# Patient Record
Sex: Female | Born: 1990 | Race: White | Hispanic: No | Smoking: Never smoker
Health system: Southern US, Community
[De-identification: ages and names within clinical notes are randomized; demographics above are authoritative.]

## PROBLEM LIST (undated history)

## (undated) DIAGNOSIS — R011 Cardiac murmur, unspecified: Secondary | ICD-10-CM

## (undated) DIAGNOSIS — E611 Iron deficiency: Secondary | ICD-10-CM

## (undated) DIAGNOSIS — F329 Major depressive disorder, single episode, unspecified: Secondary | ICD-10-CM

## (undated) DIAGNOSIS — E162 Hypoglycemia, unspecified: Secondary | ICD-10-CM

## (undated) DIAGNOSIS — F419 Anxiety disorder, unspecified: Secondary | ICD-10-CM

## (undated) DIAGNOSIS — R0600 Dyspnea, unspecified: Secondary | ICD-10-CM

## (undated) DIAGNOSIS — F32A Depression, unspecified: Secondary | ICD-10-CM

---
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
## 2012-11-19 ENCOUNTER — Encounter: Payer: Self-pay | Admitting: Obstetrics & Gynecology

## 2012-12-18 ENCOUNTER — Emergency Department: Payer: Self-pay | Admitting: Emergency Medicine

## 2012-12-18 LAB — URINALYSIS, COMPLETE
Bilirubin,UR: NEGATIVE
Blood: NEGATIVE
Protein: NEGATIVE
RBC,UR: 4 /HPF (ref 0–5)
Specific Gravity: 1.024 (ref 1.003–1.030)
WBC UR: 7 /HPF (ref 0–5)

## 2012-12-18 LAB — COMPREHENSIVE METABOLIC PANEL
Albumin: 3.4 g/dL (ref 3.4–5.0)
Alkaline Phosphatase: 68 U/L (ref 50–136)
BUN: 7 mg/dL (ref 7–18)
Calcium, Total: 9.2 mg/dL (ref 8.5–10.1)
Co2: 23 mmol/L (ref 21–32)
Creatinine: 0.58 mg/dL — ABNORMAL LOW (ref 0.60–1.30)
EGFR (African American): >60
Potassium: 3.8 mmol/L (ref 3.5–5.1)
SGOT(AST): 25 U/L (ref 15–37)
Total Protein: 7.6 g/dL (ref 6.4–8.2)

## 2012-12-18 LAB — CBC
HCT: 38.1 % (ref 35.0–47.0)
MCH: 33.1 pg (ref 26.0–34.0)
MCHC: 35.2 g/dL (ref 32.0–36.0)
RBC: 4.05 10*6/uL (ref 3.80–5.20)
WBC: 11.8 10*3/uL — ABNORMAL HIGH (ref 3.6–11.0)

## 2012-12-18 LAB — LIPASE, BLOOD: Lipase: 68 U/L — ABNORMAL LOW (ref 73–393)

## 2012-12-19 LAB — URINE CULTURE

## 2012-12-21 ENCOUNTER — Encounter: Payer: Self-pay | Admitting: Obstetrics and Gynecology

## 2012-12-21 LAB — URINALYSIS, COMPLETE
Blood: NEGATIVE
Nitrite: NEGATIVE
Ph: 5 (ref 4.5–8.0)
RBC,UR: 4 /HPF (ref 0–5)
Specific Gravity: 1.029 (ref 1.003–1.030)
Squamous Epithelial: 28
WBC UR: 29 /HPF (ref 0–5)

## 2012-12-21 LAB — COMPREHENSIVE METABOLIC PANEL
Albumin: 3.4 g/dL (ref 3.4–5.0)
Alkaline Phosphatase: 67 U/L (ref 50–136)
BUN: 6 mg/dL — ABNORMAL LOW (ref 7–18)
Bilirubin,Total: 0.5 mg/dL (ref 0.2–1.0)
Calcium, Total: 8.9 mg/dL (ref 8.5–10.1)
Creatinine: 0.57 mg/dL — ABNORMAL LOW (ref 0.60–1.30)
EGFR (African American): >60
EGFR (Non-African Amer.): >60
SGOT(AST): 21 U/L (ref 15–37)
SGPT (ALT): 27 U/L (ref 12–78)

## 2012-12-21 LAB — AMYLASE: Amylase: 33 U/L (ref 25–115)

## 2012-12-21 LAB — LIPASE, BLOOD: Lipase: 67 U/L — ABNORMAL LOW (ref 73–393)

## 2012-12-30 ENCOUNTER — Emergency Department: Payer: Self-pay | Admitting: Emergency Medicine

## 2012-12-30 LAB — CBC
HGB: 12.9 g/dL (ref 12.0–16.0)
MCH: 32.6 pg (ref 26.0–34.0)
MCHC: 33.9 g/dL (ref 32.0–36.0)
Platelet: 344 10*3/uL (ref 150–440)
WBC: 14.5 10*3/uL — ABNORMAL HIGH (ref 3.6–11.0)

## 2012-12-30 LAB — COMPREHENSIVE METABOLIC PANEL
BUN: 6 mg/dL — ABNORMAL LOW (ref 7–18)
Bilirubin,Total: 0.2 mg/dL (ref 0.2–1.0)
Chloride: 109 mmol/L — ABNORMAL HIGH (ref 98–107)
Co2: 24 mmol/L (ref 21–32)
EGFR (African American): >60
Glucose: 72 mg/dL (ref 65–99)
Osmolality: 274 (ref 275–301)
Potassium: 3.9 mmol/L (ref 3.5–5.1)
SGPT (ALT): 26 U/L (ref 12–78)
Sodium: 139 mmol/L (ref 136–145)
Total Protein: 7.1 g/dL (ref 6.4–8.2)

## 2012-12-30 LAB — URINALYSIS, COMPLETE
Bilirubin,UR: NEGATIVE
Nitrite: NEGATIVE
Protein: 30
RBC,UR: 2 /HPF (ref 0–5)
Specific Gravity: 1.029 (ref 1.003–1.030)
Squamous Epithelial: 31

## 2013-01-07 ENCOUNTER — Encounter: Payer: Self-pay | Admitting: Obstetrics and Gynecology

## 2013-05-31 ENCOUNTER — Observation Stay: Payer: Self-pay

## 2013-06-05 ENCOUNTER — Observation Stay: Payer: Self-pay

## 2013-06-05 LAB — URINALYSIS, COMPLETE
Bilirubin,UR: NEGATIVE
Glucose,UR: NEGATIVE mg/dL (ref 0–75)
Nitrite: NEGATIVE
Ph: 5 (ref 4.5–8.0)
Protein: NEGATIVE
RBC,UR: 56 /HPF (ref 0–5)
Squamous Epithelial: 26

## 2013-06-06 ENCOUNTER — Inpatient Hospital Stay: Payer: Self-pay | Admitting: Obstetrics and Gynecology

## 2013-06-06 LAB — CBC WITH DIFFERENTIAL/PLATELET
Basophil #: 0.1 10*3/uL (ref 0.0–0.1)
HCT: 34 % — ABNORMAL LOW (ref 35.0–47.0)
HGB: 11.6 g/dL — ABNORMAL LOW (ref 12.0–16.0)
Lymphocyte %: 10.9 %
MCH: 33 pg (ref 26.0–34.0)
MCV: 97 fL (ref 80–100)
Monocyte #: 1.2 x10 3/mm — ABNORMAL HIGH (ref 0.2–0.9)
Monocyte %: 5.6 %
Neutrophil #: 17.9 10*3/uL — ABNORMAL HIGH (ref 1.4–6.5)
Neutrophil %: 83 %
Platelet: 310 10*3/uL (ref 150–440)
RBC: 3.52 10*6/uL — ABNORMAL LOW (ref 3.80–5.20)
RDW: 12.2 % (ref 11.5–14.5)
WBC: 21.6 10*3/uL — ABNORMAL HIGH (ref 3.6–11.0)

## 2013-06-07 LAB — HEMATOCRIT: HCT: 32 % — ABNORMAL LOW (ref 35.0–47.0)

## 2014-04-09 ENCOUNTER — Observation Stay: Payer: Self-pay | Admitting: Obstetrics and Gynecology

## 2014-04-09 LAB — URINALYSIS, COMPLETE
BLOOD: NEGATIVE
Bacteria: NONE SEEN
Bilirubin,UR: NEGATIVE
GLUCOSE, UR: NEGATIVE mg/dL (ref 0–75)
Hyaline Cast: 2
KETONE: NEGATIVE
Leukocyte Esterase: NEGATIVE
Nitrite: NEGATIVE
Ph: 6 (ref 4.5–8.0)
Protein: NEGATIVE
SPECIFIC GRAVITY: 1.027 (ref 1.003–1.030)

## 2014-05-13 ENCOUNTER — Observation Stay: Payer: Self-pay | Admitting: Obstetrics and Gynecology

## 2014-05-13 LAB — URINALYSIS, COMPLETE
BACTERIA: NONE SEEN
BILIRUBIN, UR: NEGATIVE
BLOOD: NEGATIVE
GLUCOSE, UR: NEGATIVE mg/dL (ref 0–75)
KETONE: NEGATIVE
NITRITE: NEGATIVE
PH: 6 (ref 4.5–8.0)
Protein: NEGATIVE
SPECIFIC GRAVITY: 1.008 (ref 1.003–1.030)
WBC UR: 1 /HPF (ref 0–5)

## 2014-05-13 LAB — HEMOGLOBIN: HGB: 11.3 g/dL — ABNORMAL LOW (ref 12.0–16.0)

## 2014-05-13 LAB — HEMATOCRIT: HCT: 34.2 % — ABNORMAL LOW (ref 35.0–47.0)

## 2014-06-23 ENCOUNTER — Observation Stay: Payer: Self-pay | Admitting: Obstetrics and Gynecology

## 2014-06-23 LAB — URINALYSIS, COMPLETE
BILIRUBIN, UR: NEGATIVE
Blood: NEGATIVE
Glucose,UR: NEGATIVE mg/dL (ref 0–75)
KETONE: NEGATIVE
Nitrite: NEGATIVE
Ph: 5 (ref 4.5–8.0)
RBC,UR: 3 /HPF (ref 0–5)
SPECIFIC GRAVITY: 1.032 (ref 1.003–1.030)
Squamous Epithelial: 5
WBC UR: 9 /HPF (ref 0–5)

## 2014-06-24 LAB — FETAL FIBRONECTIN
APPEARANCE: NORMAL
Fetal Fibronectin: NEGATIVE

## 2014-08-04 ENCOUNTER — Observation Stay: Payer: Self-pay | Admitting: Obstetrics and Gynecology

## 2014-08-06 ENCOUNTER — Observation Stay: Payer: Self-pay | Admitting: Obstetrics and Gynecology

## 2014-08-06 LAB — URINALYSIS, COMPLETE
Bacteria: NONE SEEN
Bilirubin,UR: NEGATIVE
Blood: NEGATIVE
Glucose,UR: NEGATIVE mg/dL (ref 0–75)
NITRITE: NEGATIVE
PH: 5 (ref 4.5–8.0)
RBC,UR: 1 /HPF (ref 0–5)
SPECIFIC GRAVITY: 1.024 (ref 1.003–1.030)
Squamous Epithelial: 1
WBC UR: 2 /HPF (ref 0–5)

## 2014-08-06 LAB — COMPREHENSIVE METABOLIC PANEL
ALBUMIN: 2.5 g/dL — AB (ref 3.4–5.0)
AST: 20 U/L (ref 15–37)
Alkaline Phosphatase: 127 U/L — ABNORMAL HIGH
Anion Gap: 11 (ref 7–16)
BILIRUBIN TOTAL: 0.3 mg/dL (ref 0.2–1.0)
BUN: 8 mg/dL (ref 7–18)
CREATININE: 0.54 mg/dL — AB (ref 0.60–1.30)
Calcium, Total: 8.3 mg/dL — ABNORMAL LOW (ref 8.5–10.1)
Chloride: 107 mmol/L (ref 98–107)
Co2: 22 mmol/L (ref 21–32)
EGFR (Non-African Amer.): >60
GLUCOSE: 88 mg/dL (ref 65–99)
OSMOLALITY: 277 (ref 275–301)
POTASSIUM: 3.8 mmol/L (ref 3.5–5.1)
SGPT (ALT): 15 U/L
SODIUM: 140 mmol/L (ref 136–145)
TOTAL PROTEIN: 6.3 g/dL — AB (ref 6.4–8.2)

## 2014-08-06 LAB — CBC WITH DIFFERENTIAL/PLATELET
Basophil #: 0.1 10*3/uL (ref 0.0–0.1)
Basophil %: 0.5 %
EOS ABS: 0.1 10*3/uL (ref 0.0–0.7)
Eosinophil %: 0.6 %
HCT: 33 % — ABNORMAL LOW (ref 35.0–47.0)
HGB: 11.2 g/dL — AB (ref 12.0–16.0)
LYMPHS PCT: 16.2 %
Lymphocyte #: 1.8 10*3/uL (ref 1.0–3.6)
MCH: 34.1 pg — ABNORMAL HIGH (ref 26.0–34.0)
MCHC: 33.8 g/dL (ref 32.0–36.0)
MCV: 101 fL — AB (ref 80–100)
Monocyte #: 0.5 x10 3/mm (ref 0.2–0.9)
Monocyte %: 4.4 %
NEUTROS ABS: 8.5 10*3/uL — AB (ref 1.4–6.5)
Neutrophil %: 78.3 %
Platelet: 263 10*3/uL (ref 150–440)
RBC: 3.28 10*6/uL — AB (ref 3.80–5.20)
RDW: 12.5 % (ref 11.5–14.5)
WBC: 10.8 10*3/uL (ref 3.6–11.0)

## 2014-08-06 LAB — AMYLASE: Amylase: 38 U/L (ref 25–115)

## 2014-08-06 LAB — LIPASE, BLOOD: LIPASE: 97 U/L (ref 73–393)

## 2014-08-13 ENCOUNTER — Observation Stay: Payer: Self-pay | Admitting: Emergency Medicine

## 2014-08-13 LAB — URINALYSIS, COMPLETE
BLOOD: NEGATIVE
Bilirubin,UR: NEGATIVE
GLUCOSE, UR: NEGATIVE mg/dL (ref 0–75)
NITRITE: NEGATIVE
Ph: 5 (ref 4.5–8.0)
Protein: 100
RBC,UR: 2 /HPF (ref 0–5)
Specific Gravity: 1.03 (ref 1.003–1.030)
Squamous Epithelial: 1

## 2014-08-13 LAB — COMPREHENSIVE METABOLIC PANEL
ALT: 18 U/L
AST: 21 U/L (ref 15–37)
Albumin: 2.7 g/dL — ABNORMAL LOW (ref 3.4–5.0)
Alkaline Phosphatase: 161 U/L — ABNORMAL HIGH
Anion Gap: 12 (ref 7–16)
BILIRUBIN TOTAL: 0.5 mg/dL (ref 0.2–1.0)
BUN: 9 mg/dL (ref 7–18)
CALCIUM: 8.5 mg/dL (ref 8.5–10.1)
Chloride: 107 mmol/L (ref 98–107)
Co2: 19 mmol/L — ABNORMAL LOW (ref 21–32)
Creatinine: 0.61 mg/dL (ref 0.60–1.30)
EGFR (Non-African Amer.): >60
GLUCOSE: 111 mg/dL — AB (ref 65–99)
Osmolality: 275 (ref 275–301)
Potassium: 3.6 mmol/L (ref 3.5–5.1)
Sodium: 138 mmol/L (ref 136–145)
Total Protein: 7.1 g/dL (ref 6.4–8.2)

## 2014-08-13 LAB — CBC WITH DIFFERENTIAL/PLATELET
Basophil #: 0 10*3/uL (ref 0.0–0.1)
Basophil %: 0.2 %
EOS ABS: 0 10*3/uL (ref 0.0–0.7)
Eosinophil %: 0.1 %
HCT: 36.9 % (ref 35.0–47.0)
HGB: 12.1 g/dL (ref 12.0–16.0)
LYMPHS PCT: 4.3 %
Lymphocyte #: 0.5 10*3/uL — ABNORMAL LOW (ref 1.0–3.6)
MCH: 33 pg (ref 26.0–34.0)
MCHC: 32.8 g/dL (ref 32.0–36.0)
MCV: 101 fL — ABNORMAL HIGH (ref 80–100)
Monocyte #: 0.3 x10 3/mm (ref 0.2–0.9)
Monocyte %: 2.6 %
NEUTROS ABS: 11.4 10*3/uL — AB (ref 1.4–6.5)
NEUTROS PCT: 92.8 %
Platelet: 296 10*3/uL (ref 150–440)
RBC: 3.67 10*6/uL — AB (ref 3.80–5.20)
RDW: 12.6 % (ref 11.5–14.5)
WBC: 12.3 10*3/uL — AB (ref 3.6–11.0)

## 2014-08-13 LAB — ED INFLUENZA
H1N1FLUPCR: NOT DETECTED
Influenza A By PCR: NEGATIVE
Influenza B By PCR: NEGATIVE

## 2014-08-13 LAB — LIPASE, BLOOD: Lipase: 92 U/L (ref 73–393)

## 2014-08-21 ENCOUNTER — Observation Stay: Payer: Self-pay | Admitting: Obstetrics and Gynecology

## 2014-09-02 ENCOUNTER — Observation Stay: Payer: Self-pay | Admitting: Obstetrics & Gynecology

## 2014-09-02 ENCOUNTER — Observation Stay: Payer: Self-pay | Admitting: *Deleted

## 2014-09-03 ENCOUNTER — Inpatient Hospital Stay: Payer: Self-pay | Admitting: Obstetrics & Gynecology

## 2014-11-11 NOTE — Consult Note (Signed)
Referral Information:  Reason for Referral Followup MFM consultation for hyperemesis   Referring Physician [facility]   Prenatal Hx Marcelino DusterMichelle is a 24 year-old G2 P0010 at 17 weeks who presents for followup visit in setting of hyperemesis gravidarium. She was seen in the ED last week after a hypoglycemic epidsode. Since then she is doing much better. She has gained 4 pounds (164 to 168) and is taking Diclegis.  She got her zofran and phenergan scripts filled but has not yet had to take them.  She is staying hydrated and keeping food down.   Home Medications: Medication Instructions Status  Diclegis 10 mg-10 mg oral delayed release tablet 1 tab(s) orally 2 times a day, As Needed - for Nausea, Vomiting Active  promethazine 25 mg oral tablet 1 tab(s) orally every 6 hours, As Needed - for Nausea, Vomiting Active  MiraLax - oral powder for reconstitution 1  orally once a day Active  Colace sodium 50 mg oral capsule 1 cap(s) orally 2 times a day, As Needed - for Constipation Active  Zofran 4 mg oral tablet, disintegrating 1 tab(s) orally 3 times a day, As Needed - for Nausea, Vomiting Active   Allergies:   No Known Allergies:   Vital Signs/Notes:  Nursing Vital Signs:  **Vital Signs.:   19-Jun-14 10:53  Pulse Pulse 80  Systolic BP Systolic BP 126  Diastolic BP (mmHg) Diastolic BP (mmHg) 88   Review Of Systems:  Subjective See HPI. No palipations. No anxiety symptoms.   Fever/Chills No   Cough No   Diarrhea No   Constipation No   Nausea/Vomiting see HPI   Medications/Allergies Reviewed Medications/Allergies reviewed  Not yet had to take zofran or phenergan.    Additional Lab/Radiology Notes Thyroid function test (12/21/12): TSH 0.111 (low) T4: 13.7 HCG: 101,815 (12/18/12)   Impression/Recommendations:  Impression 24 year-old G2 P0010 at 17 weeks with improving hyperemesis.   Recommendations 1. Hyperemesis. We discussed importance of staying hydrated and food choice.  Continue diclegis. Can add zofran and/or phenergan for bad days. Would expect that symptoms will continue to improve. Please repeat Thyroid function tests in approximately 2 weeks. Anticipate that they will improve. The suppressed TSH and mildly elevated T4 was probably due to hCG-mediated thyroid gland stimulation.  2. Father of the baby with Marfans. See Genetic counselign note and Dr. Vivien RossettiLivingston's full consultation dated 12/21/12 for pregnancy recommendations. Pt states she has an anatomy [facility] scheduled [facility] in 2 weeks.    Total Time Spent with Patient 15 minutes   >50% of visit spent in couseling/coordination of care yes   Office Use Only [id number]  Office Visit Level 3 (15min) EST exp prob focused outpt   Coding Description: MATERNAL CONDITIONS/HISTORY INDICATION(S).   Hyperemesis gravidarium, mild, < 22 weeks.  Electronic Signatures: Kashis Penley, [place] (MD)  (Signed 19-Jun-14 11:34)  Authored: Referral, Home Medications, Allergies, Vital Signs/Notes, Exam, Lab/Radiology Notes, Impression, Billing, Coding Description   Last Updated: 19-Jun-14 11:34 by Amelio Brosky, [place] (MD)

## 2014-11-11 NOTE — Consult Note (Signed)
Referral Information:  Reason for Referral Update plans for prenatal diagnosis due to baby's father having a h/o of aortic replacement due to a diagnosis of Marfan syndrome and maternal hyperemesis gravidarum   Referring Physician [facility]   Prenatal Hx Married white female here with baby's father (who has a classic Marfan phenotype)  LMP 09/08/12 with EDC of 11 /25/2014 now 14 6/7 (u/s done on 11/19/2012 at 10w 5 d c/w LMP) G2 P0010  recently seen in ED for abd pain with positive gallbladder scan - pt with 11 pound weight loss since 11/19/2012 . 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 she can eat intermittently - Mountain Dew slushee at [employer] is her best tolerated food , occasionally peanut butter or pizza will go down - not crackers or ginger ale . No usign any antiemetics - "afraid drugs will hurt the baby". Still urintaing 2-3 times /day very dark. Vomited 8 times on saturday.  Baby's dad had open heart surgery in April with Dr Van Trigt in [place] working on getting SSI due to his disability.   Past Obstetrical Hx sab at 6-8 weeks with a different father   Allergies:   No Known Allergies:   Vital Signs/Notes:  Nursing Vital Signs:  **Vital Signs.:   02-Jun-14 10:12  Vital Signs Type Routine    10:12  Temperature Temperature (F) 97.2  Celsius 36.2  Temperature Source oral  Pulse Pulse 87  Respirations Respirations 18  Systolic BP Systolic BP 103  Diastolic BP (mmHg) Diastolic BP (mmHg) 66  Mean BP 78   Perinatal Consult:  Past Medical History cont'd anemia  elevated BMI never admitted to hospital   PSurg Hx none   Occupation Mother grocery cashier   Occupation Father disabled due to heart issues   Soc Hx married   Review Of Systems:  Medications/Allergies Reviewed Medications/Allergies reviewed     Additional Lab/Radiology Notes Pap LSIL for pp repeat Gall bladder u/s -  sludge no obstructing stones   Impression/Recommendations:  Impression 1-IUP at 15 weeks 2- father of baby has Marfan diagnosis with serious cardiac phenotype requiring surgery -Previously  they met with genetic counseling and discussed 50% risk of infant having diagnosis , "neonatal Marfan syndrome " is uncommon but there are reports of this being a cause of fetal arthrogryposis, there are occasional reports of diagnosis of marfan's being made based on neonatal phenotype and echo abnormalities noted at less than 6 months of age . 3 Hyperemesis interfering with work and significant weight loss, still urinating and tolerating some foods   Recommendations 1 I discussed with our genetic counselor - we suggest a third trimester ultrasound to r/o arthrogryposis and a fetal echo at 22-28 weeks - if abnormalities delivery at a tertiary care center to allow prompt neonatal evaluation.  If normal u/s and echo then reasonable to deliver at [facility].  Infant will need evaluation by a pediatric genetics specialist - Dr Stephanie Wechsler at [facility] is a connective tissue disorder specialist who could provide this service. 2. Hyperemesis wiht weight loss and ER visit- check labs and UA today . I offered Zofran , phenergan and Diclegys to try and control symptoms . I recommended she come in to ER if unable to tolerate liquids. Couple has an unusual dynamic around the hyperemesis . ( He threatens to force feed her at night because it is in her head - he says she   gets emotional and throws up. She throws crackers at him but then lays her head in his lap in clinic.)   Plan:  Genetic Counseling done   Prenatal Diagnosis Options Level II [place], fetal echo   Ultrasound at what gestational ages 78 and 14 weeks   Delivery Mode Vaginal   Additional Testing complete chemistries , amylase and lipase    Comments genetics note updated to include late u/s and echo    Total Time Spent with Patient 30 minutes   >50% of visit  spent in couseling/coordination of care yes   Office Use Only [id number]  Level 2 (71mn) NEW office consult exp prob focused   Coding Description: GENETIC SCREENING/COUNSELING INDICATION(S).   MATERNAL CONDITIONS/HISTORY INDICATION(S).   Hyperemesis gravidarium, mild, < 22 weeks.   OTHER: paternal marfan.  Electronic Signatures: LSharyn Creamer(MD)  (Signed 02-Jun-14 12:15)  Authored: Referral, Home Medications, Allergies, Vital Signs/Notes, Consult, Exam, Lab/Radiology Notes, Impression, Plan, Other Comments, Billing, Coding Description   Last Updated: 02-Jun-14 12:15 by LSharyn Creamer(MD)

## 2014-11-20 NOTE — Consult Note (Signed)
PATIENT NAME:  Mary Nash, Mary Nash MR#:  [id number] DATE OF BIRTH:  November 04, 1990  DATE OF CONSULTATION:  08/14/2014  CONSULTING PHYSICIAN:  Ival Basquez K. Guss Bundehalla, MD  PLACE OF DICTATION:  [facility] room [address], [place]  SUBJECTIVE:   The patient was seen in consultation in [place] #351A [place]. The patient is a 24 year old white female, not employed because she is on bed rest at this time because of a 32 week pregnancy.  The patient works as a Conservation officer, naturecashier at various places. The patient widowed for 3 months after being married for 1-1/2 years. The patient currently lives with her great-grandmother who is 24 years old and has dementia. In addition, the patient's grandmother lives close by and comes often to visit.  The patient has a 3861-month-old son who is living with her grandparents at this time who are taking care of her.    The patient reported that she is undergoing grief counseling from hospice and had been involved with them for the past 3 months since she lost her husband 3 months ago.  The patient reports that on the way to the hospital, grandparents mentioned that it was a good idea for her to put  up her 2561-month-old son and baby that is being born to her, for adoption.  This has upset her because she does not want to give her children up for adoption and she wants to take care of them.  In addition, recently they had to move which was 6 weeks ago and parents and grandparents expect her to be fast and do things and currently she is pregnant and she is not able to keep up with their expectations. All these things added up with her depression and she stated that she had suicidal wishes and thoughts on the way to hospital.   PAST PSYCHIATRIC HISTORY:  No previous history of inpatient hospital psychiatry.  No history of suicide attempts, not followed by any psychiatrist, except that she goes to grief counseling to hospice since she lost her husband 3 months ago.   ALCOHOL AND  DRUGS:  Denies drinking alcohol at this time. Denies any street or prescription drug abuse.  Denies using nicotine cigarettes since she is pregnant.  MENTAL STATUS: The patient is seen, lying in bed, calm, quiet, and cooperative.  No agitation.  Affect is neutral and mood stable. Denies feeling depressed.  Denies feeling hopeless or helpless.  She is going through bereavement secondary to loss of her husband 3 months ago after being married to him 1-1/2 years.  No psychosis.  Does not appear to be responding to internal stimuli.  Cognition intact. General fund of information impaired.  Insight and judgment fair.  Impulse control is fair.    IMPRESSION:  1.  Bereavement secondary to loss of her husband 3 months ago. 2.  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  RECOMMENDATIONS:  No psychiatric medication at this time as patient does not need the same.  The patient should continue her grief counseling with hospice as they have been given her help and in addition attend [facility] sessions [facility] .  She and her family need counseling where they can understand each other better and have better understanding so that expectations can be made accordingly and patient's wish that she wants to take care of her kids should be honored.  Social services is to be contacted for help as needed so that she will be able  to care for her children as per her wishes, and patient contracts for safety.   ____________________________ Jannet Mantis. Guss Bunde, MD skc:LT D: 08/14/2014 13:50:11 ET T: 08/14/2014 19:14:41 ET JOB#: [id number]  cc: Monika Salk K. Guss Bunde, MD, <Dictator> Beau Fanny MD ELECTRONICALLY SIGNED 08/20/2014 11:55

## 2014-11-29 NOTE — H&P (Signed)
L&D Evaluation:  History Expanded:  HPI 24 yo G3P1011 WF at 9739 6/[redacted] weeks EGA w vaginal leaking, urine vs amniotic fluid.  No contractions, bleeding.  [facility], see records.   Presents with leaking fluid   Patient's Medical History Anxiety, Anemia, obesity, vitamin B12 and D deficiencies,   Patient's Surgical History none   Medications Pre Natal Vitamins  magnesium 500 mgm daily, and Zoloft 25 mgm   Allergies NKDA   Social History none   Family History Non-Contributory   ROS:  ROS see HPI   Exam:  Vital Signs stable   Urine Protein 2+   General no apparent distress   Mental Status clear   Chest clear   Heart normal sinus rhythm, no murmur/gallop/rubs   Abdomen gravid, non-tender   Estimated Fetal Weight Average for gestational age   Fetal Position cephalic   Back no CVAT   Edema no edema   Pelvic no external lesions, 1/60/hi,  neg ferning (pos nitrazine after exam)   Mebranes Intact   FHT normal rate with no decels   Fetal Heart Rate 140   Ucx absent   Skin dry   Impression:  Impression IUP at  2942w6d gestation incontinence of urine, not rom.   Plan:  Plan EFM/NST, fluids, discharge   Follow Up Appointment already scheduled   Electronic Signatures: Letitia LibraHarris, Robert Paul (MD)  (Signed 12-Feb-16 13:15)  Authored: L&D Evaluation   Last Updated: 12-Feb-16 13:15 by Letitia LibraHarris, Robert Paul (MD)

## 2014-11-29 NOTE — H&P (Signed)
L&D Evaluation:  History:  HPI -CC: periumbilical pain and nausea/vomiting -HPI: 24 y/o G3P1011 @ 36/0 (EDC 2/13) with the above CC. Preg c/b hypoglycemia episodes, panic attacks, FOB (who passed during this pregnancy) with Marfan's, close interval pregnancy. Patient states that s/s started this morning and that she's thrown up about 4x (clear, mucusy, no blood) and no diarrhea, fevers, chills, dysuria, sick contacts, new/different foods. And no PTL s/s or decreased FM   Medications Pre Natal Vitamins   Allergies NKDA   Exam:  Vital Signs AF VS normal and stable   General no apparent distress   Mental Status clear   Abdomen gravid, non-tender   Back no CVAT   Pelvic 0.5cm per RN (unchanged from office)   Mebranes Intact   FHT 135 baseline, +accels, two slight variables, mod var   Ucx quiet   Impression:  Impression dehydration   Plan:  Comments *IUP: rNST *Variables: AFI by Radiology, 13.4, cephalic. *Abd pain: negative labor eval *FEN/GI: labs (CMP, CBC, lipase, amylase) negative except for u/a with ketones. patient feeling better with PO hydration of water. pt told to avoid juices since it made her feel worse and to graze throughout the day   Follow Up Appointment already scheduled. weds-pt told to keep   Electronic Signatures: Herman BingPickens, Vandell Kun (MD)  (Signed 16-Jan-16 14:26)  Authored: L&D Evaluation   Last Updated: 16-Jan-16 14:26 by Maitland BingPickens, Malayia Spizzirri (MD)

## 2014-11-29 NOTE — H&P (Signed)
L&D Evaluation:  History:  HPI 24 yo G2 P0010 with EDD of 06/15/13 per LMP & 1st trimester US, presents at 585w3d with reports of contractions and rupture of membranes around 1:30am. She was seen yesterday with c/o back pain that revealed at UTI. She was prescribed keflex and discharged home. Her prenatal care is significant for decreased TSH, and hyperemesis. She is A+, RI, VI, GBS-.   Presents with contractions, leaking fluid   Patient's Medical History other  anemia   Patient's Surgical History none   Medications Pre Natal Vitamins  Diclegis   Allergies NKDA   Social History none   Family History Non-Contributory   ROS:  ROS All systems were reviewed.  HEENT, CNS, GI, GU, Respiratory, CV, Renal and Musculoskeletal systems were found to be normal.   Exam:  Vital Signs stable   General no apparent distress   Mental Status clear   Chest clear   Heart normal sinus rhythm   Abdomen gravid, non-tender   Edema no edema   Pelvic no external lesions, at 1:30am by RN- 3/90/-1   Mebranes Ruptured   Description clear   FHT 140's category 1 tracing, +FM   Ucx every 3-5 minutes   Skin dry, no lesions, no rashes   Lymph no lymphadenopathy   Impression:  Impression UTI, reactive NST, IUP at 145w3d, SROM, labor   Plan:  Plan EFM/NST, monitor contractions and for cervical change, IV pain medication as needed, anticipate svd   Follow Up Appointment need to schedule   Electronic Signatures: Jannet MantisSubudhi, Garvey Westcott (CNM)  (Signed [phone]-Nov-14 04:39)  Authored: L&D Evaluation   Last Updated: 16-Nov-14 04:39 by Jannet MantisSubudhi, Ismerai Bin (CNM)

## 2014-11-29 NOTE — H&P (Signed)
L&D Evaluation:  History:  HPI 24 yo G3P1011 at 7469w0d [facility] presenting for no fetal movement over the last week.  She reports No LOF, no VB, no ctx. No history of abdominal trauma.  Pregnancy thus far has been uncomplicated other than close interconceptual spacing.  The patient states she felt movement a week ago and then none since.  SHe has been particularly worried as her huband recently underwent aortic arch replacement secondary to Marfan's syndrome and subsequently has gone into renal failure requiring dialysis.  She does have an anatomy scan scheduled for 04/14/2014, quad screen this pregnancy was normal.   Presents with decreased fetal movement   Patient's Medical History No Chronic Illness   Patient's Surgical History none   Medications Pre Natal Vitamins   Allergies NKDA   Social History none   Family History Non-Contributory   ROS:  ROS All systems were reviewed.  HEENT, CNS, GI, GU, Respiratory, CV, Renal and Musculoskeletal systems were found to be normal.   Exam:  Vital Signs stable   Urine Protein negative dipstick   General no apparent distress   Mental Status clear   Chest no increased work of breathing   Abdomen gravid, non-tender   Estimated Fetal Weight Average for gestational age   Edema no edema   FHT 140 BPM by doppler   Impression:  Impression decreased fetal movement   Plan:  Plan UA   Comments 1) No fetal movement - reassured by positive FHT's.  Discussed movement not necessarily consistent at this stage of preganancy  2) Fetus - +FHT, previable  3) A pos / RNI / VZI / HIV neg / RPR NR / HBsAg neg / quad screen negative  4) Urine - appears very concentrated but negative for UTI  5) Disposition - discharge home with follow up in place on 04/14/14   Electronic Signatures: Lorrene ReidStaebler, Tyresha Fede M (MD)  (Signed 19-Sep-15 12:35)  Authored: L&D Evaluation   Last Updated: 19-Sep-15 12:35 by Lorrene ReidStaebler, Alexismarie Flaim M (MD)

## 2014-11-29 NOTE — H&P (Signed)
L&D Evaluation:  History Expanded:  HPI 24 yo G2 P0010 with EDD of 06/15/13 per LMP & 1st trimester US, presents at 5798w6d with c/o leaking since 05/29/13. Reports occasional contractions, +FM, no VB.   Blood Type (Maternal) A positive   Group B Strep Results Maternal (Result >5wks must be treated as unknown) negative   Maternal HIV Negative   Maternal Syphilis Ab Nonreactive   Maternal Varicella Immune   Rubella Results (Maternal) immune   Patient's Medical History No Chronic Illness   Patient's Surgical History none   Medications Pre Natal Vitamins  Diclegis   Allergies NKDA   Social History none   Exam:  Vital Signs stable   General no apparent distress   Mental Status clear   Abdomen gravid, non-tender   Pelvic no external lesions, 1 cm per RN, Speculum exam: negative pooling, nitrizine & ferning, wet mount negative   Mebranes Intact   FHT category 1 tracing   Ucx irregular   Impression:  Impression IUP at 2598w6d - intact membranes   Plan:  Plan discharge   Comments Labor precautions reviewed   Follow Up Appointment already scheduled. 11/13   Electronic Signatures: Vella KohlerBrothers, Arlina Sabina K (CNM)  (Signed [phone]-Nov-14 22:50)  Authored: L&D Evaluation   Last Updated: 10-Nov-14 22:50 by Vella KohlerBrothers, Rosealee Recinos K (CNM)

## 2014-11-29 NOTE — H&P (Signed)
L&D Evaluation:  History:  HPI -CC: nausea/vomiting  -HPI: 24 y/o G3P1011 @ 38 weeks 1 day gestation [facility](EDC 2/13) with the above chief complaint seen for similar symptoms last week. She called because she did not contractions q7-768min earlier today but she took a nap and contractions have stopped.  N/V has persisted.  No sick contacts although at last week visit she did note that son had similar symptoms.  No fevers, chills, dysuria. +FM, no LOF, no VB.   Preg c/b hypoglycemia episodes, panic attacks, FOB (who passed during this pregnancy) with Marfan's, close interval pregnancy. She was just seen in L&D last week for nausea/vomiting and periumbilical pain.  Was given a RX for Zoloft this past week and she has just taken one dose so far. Was on Lexapro and Ativan for anxiety earlier in the pregnancy.   Presents with nausea/vomiting, and diarrhea   Patient's Medical History Anxiety, Anemia, obesity, vitamin B12 and D deficiencies,   Patient's Surgical History none   Medications Pre Natal Vitamins  magnesium 500 mgm daily, and Zoloft 25 mgm   Allergies NKDA   Social History none   Family History Non-Contributory   ROS:  ROS see HPI   Exam:  Vital Signs stable   Urine Protein 2+   General no apparent distress   Mental Status clear   Chest clear   Heart normal sinus rhythm, no murmur/gallop/rubs   Abdomen gravid, non-tender   Estimated Fetal Weight Average for gestational age   Fetal Position cephalic   Edema no edema   Pelvic no external lesions, ftp   Mebranes Intact   FHT normal rate with no decels, 140, moderat, no accels, no decels   Fetal Heart Rate 140   Ucx absent   Skin dry   Impression:  Impression IUP at  1478w1d gestation gastroenteritis   Plan:  Comments - IV hydration and zofran 8mg  IV once - If patient improves po challange - May need to stay overnight for IV hydration and IV antiemtics.   - Monitor for reactive tracing prior to discharge.    Electronic Signatures: Lorrene ReidStaebler, Amanada Philbrick M (MD)  (Signed 31-Jan-16 15:55)  Authored: L&D Evaluation   Last Updated: 31-Jan-16 15:55 by Lorrene ReidStaebler, Ashkan Chamberland M (MD)

## 2014-11-29 NOTE — H&P (Signed)
L&D Evaluation:  History:  HPI 24 yo G3P1011 [facility] 6951w5d [facility] presenting with dizzyness. which has been a complaint for her off and on this pregnancy. Exacerbated by standing quickly or sitting prolonged periods of time.  Better when she lays flat.  No CP, SOB, no other recent illness N/V diarrhea.  Prior work up includes EKG.  Not anemic on 28 weeks labs.  Patient denies CTX, LOF, +FM   Presents with other, see HPI   Patient's Medical History No Chronic Illness  hypoglycemia in previous pregnancy   Patient's Surgical History none   Medications Pre Natal Vitamins   Allergies NKDA   Social History none  Husband in hospital, panic attacks   Family History Non-Contributory   ROS:  General normal, dizzy, lightheade, chest tightness   HEENT normal   CNS normal   GI normal   GU normal   Resp normal   CV dizzy, lightheade, chest tightness   Renal normal   MS normal   Exam:  Vital Signs stable   Urine Protein negative dipstick   General no apparent distress   Mental Status clear   Chest clear   Heart normal sinus rhythm, no murmur/gallop/rubs   Abdomen gravid, non-tender   Estimated Fetal Weight Average for gestational age   Back no CVAT   Edema no edema   Clonus negative   Mebranes Intact   FHT normal rate with no decels   Ucx absent   Skin no lesions   Lymph no lymphadenopathy   Impression:  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  Also discussed use of compression hose.  Has follow up in place on 08/10/14   Electronic Signatures: Lorrene ReidStaebler, Karolyna Bianchini M (MD)  (Signed 14-Jan-16 20:48)  Authored: L&D Evaluation   Last Updated: 14-Jan-16 20:48 by Lorrene ReidStaebler, Eriana Suliman M (MD)

## 2014-11-29 NOTE — H&P (Signed)
L&D Evaluation:  History:  HPI -CC: worsened UCs -HPI: 24 y/o G3P1011 @ 29/5 with the above CC. Preg c/b CI pregnancy (TSVD 05/2013), h/o panic attacks (on lexapro and prn ativan), FOB with marfan's (patient declined additional work up except MFM scans), FOB passing at approx 24 weeks, and episodes of hypoglycemia.  Patient states s/s have been occuring for the past six weeks but felt worse tonight with 2-3 really painful UCs on the way here but aside from that, feeling her baseline of occasional UCs.  No VB, LOF or decreased FM, dysuria. Pt denies anything in vagina in past 24-48hrs   Patient's Medical History as above   Patient's Surgical History none   Medications as above   Allergies NKDA   Social History as above   Exam:  Vital Signs AF VS normal and stable   General no apparent distress   Mental Status clear   Chest clear   Heart rrr no mrgs   Abdomen gravid, non-tender   Back no CVAT   Pelvic FT externally but cl/long/high/thick   Mebranes Intact   FHT 120 baseline, +accels, no decels, mod var   Ucx starting to space out and becoming irregular from q3-10171m initially   Other SSE: minimal normal vaginal discharge in vault, cervix and vagina normal with cervix visually closed neg nitrazine and wet prep   Impression:  Impression BH UCs   Plan:  Comments *IUP: rNST, category I tracing. fetal status reassuring *EOL: follow up FFN. recheck SVE then. neg unchanged, d/c to home -UCs improving on EFM with PO hydration -will send UCx for sx U/A but will start treatment before results come back  RTC as scheduled   Electronic Signatures: [place] BingPickens, Jance Siek (MD)  (Signed 03-Dec-15 23:56)  Authored: L&D Evaluation   Last Updated: 03-Dec-15 23:56 by [place] BingPickens, Pat Elicker (MD)

## 2014-11-29 NOTE — H&P (Signed)
L&D Evaluation:  History:  HPI -CC: nausea/vomiting/diarrhea since 0300 this AM. -HPI: 24 y/o G3P1011 @ [facility]redacted[facility] weeks [facility] with the above CC. Vomited between 10 and 20 times today. Unable to keep down clear liquids all day. Had about 5 loose stools. (last time around 1 PM). Toddler son just got over same sx. Seen in ER initially. and begun on IV hydration and given Zofran 4 mgm IV  at 1730. Last vomited at 4 PM tonight. Has been able to keep down ice chips and sips of gingerale since arrival to L&D. Was feeling some mild BH ctxs today (no change) and has had pelvic pain for weeks. Has had some spotting today (has had intermittentlyAlso c/o leg cramps for months (takes magnesium for sx) and pain in her right chest when taking a deep breath. Denies dysuria, hematemesis, or blood in her stool. Preg c/b hypoglycemia episodes, panic attacks, FOB (who passed during this pregnancy) with Marfan's, close interval pregnancy. She was just seen in L&D last week for nausea/vomiting and periumbilical pain.  Was given a RX for Zoloft this past week and she has just taken one dose so far. Was on Lexapro and Ativan for anxiety earlier in the pregnancy.   Presents with nausea/vomiting, and diarrhea   Patient's Medical History Anxiety, Anemia, obesity, vitamin B12 and D deficiencies,    Patient's Surgical History none    Medications Pre Natal Vitamins  magnesium 500 mgm daily, and Zoloft 25 mgm   Allergies NKDA   Social History none    Family History Non-Contributory    ROS:  ROS see HPI   Exam:  Vital Signs stable  117/74. afebrile. Pulse 97-120    Urine Protein 2+   General no apparent distress   Mental Status clear    Chest clear  point tenderness in IC spaceto left of sternum    Heart normal sinus rhythm, no murmur/gallop/rubs   Abdomen gravid, tender LUS with doing leopolds   Estimated Fetal Weight Average for gestational age   Fetal Position cephalic   Edema no edema     Pelvic no external lesions, ext os FT/ int os closed/50%/-2   Mebranes Intact   FHT normal rate with no decels, 135-145 with accels to 160s to 170   Fetal Heart Rate 140    Ucx mild, q2-3 on arrival then, spaced out with IV hydration   Skin dry   Other UA-2+ketones and +2 protein. 12.3 WBC/ Hplt=296K. BUN/cr=9/0.61. Lytes WNL except CO2=19. lipase and LFTs WNL.   Impression:  Impression IUP at 37 weeks with N/V/D-probable acute gastroenteritis. Chest wall pain.   Plan:  Plan DC EFM. continue clear liquids and IV hydration overnight. Zofran if needed. probable discharge in AM.   Electronic Signatures: Trinna BalloonGutierrez, Tylan Briguglio L (CNM)  (Signed 23-Jan-16 23:59)  Authored: L&D Evaluation   Last Updated: 23-Jan-16 23:59 by Trinna BalloonGutierrez, Chanae Gemma L (CNM)

## 2014-11-29 NOTE — H&P (Signed)
L&D Evaluation:  History:  HPI 24 yo G2 P0010 with EDD of 06/15/13 per LMP & 1st trimester US, presents at 32102w2d with c/o back pain that worsened yesterday. SHe reports having back pain throughout pregnancy with aching down her left leg but now has low back pain and aching down both hips.  She also c/o decreased fetal movement since yesterday. She reports that she "thinks he has moved once today when I moved earlier" She has tried taking 1 extra strength tylenol with no relief yesterday for a total of 6 doses since yesterday. She reports that taking hot baths were helping but are not anymore. She reports frequency with urination but denies burning, or urgency. Her prenatal care  is significant for decreased TSH, and hyperemesis. She is A+, RI, VI, GBS-.   Presents with back pain, decreased fetal movement   Patient's Medical History other  anemia   Patient's Surgical History none   Medications Pre Natal Vitamins  Diclegis   Allergies NKDA   Social History none   Family History Non-Contributory   ROS:  ROS All systems were reviewed.  HEENT, CNS, GI, GU, Respiratory, CV, Renal and Musculoskeletal systems were found to be normal.   Exam:  Vital Signs stable  temp 98.0   General no apparent distress   Mental Status clear   Chest clear   Heart normal sinus rhythm   Abdomen gravid, non-tender   Back other, +CVA tenderness on the right but reports that she "always has tenderness there"   Edema no edema   Pelvic no external lesions, by RN- 1/70/-1   Mebranes Intact   FHT category 1 tracing, +FM   Ucx irregular   Skin dry, no lesions, no rashes   Lymph no lymphadenopathy   Other UA- specific gravity- 1.019, trace ketones, 2+ blood, + leukocytes, 268 WBC's, mucous present   Impression:  Impression UTI, reactive NST, IUP at 60102w2d   Plan:  Plan EFM/NST, monitor contractions and for cervical change, other, discharge, dose of abx given in L&D, rx sent home with pt for  treatment, labor precautions and FKC reviewed, f/u in office at next visit   Comments 14-18- cervix unchanged, pt continues to be afebrile.   Follow Up Appointment need to schedule   Electronic Signatures: Jannet MantisSubudhi, Emillio Ngo (CNM)  (Signed [phone]-Nov-14 14:18)  Authored: L&D Evaluation   Last Updated: 15-Nov-14 14:18 by Jannet MantisSubudhi, Jacque Byron (CNM)

## 2014-11-29 NOTE — H&P (Signed)
L&D Evaluation:  History:  HPI 24 yo G3P1011 [facility] 53[facility]d [facility] presenting with spotting, dizzyness, and intermittent chest tightening.   Pregnancy thus far has been uncomplicated other than close interconceptual spacing. The spotting started this morning and has been infrequent and pink.  No significant bright red blood. She is still breastfeeding her 6561-month old infant, and states to have had several panic attacks in the last few days.  She has been particularly worried as her huband recently underwent aortic arch replacement secondary to Marfan's syndrome and subsequently has gone into renal failure requiring dialysis, and has had a prolonged hospalization.  Patient denies CTX, LOF, +FM   Presents with other, see HPI   Patient's Medical History No Chronic Illness  hypoglycemia in previous pregnancy   Patient's Surgical History none   Medications Pre Natal Vitamins   Allergies NKDA   Social History none  Husband in hospital, panic attacks   Family History Non-Contributory   ROS:  General normal, dizzy, lightheaded, chest tightness   HEENT normal   CNS normal   GI normal   GU normal   Resp normal   CV dizzy, lightheaded, chest tightness   Renal normal   MS normal   Exam:  Vital Signs stable  EKG = NSR   Urine Protein negative dipstick   General no apparent distress   Mental Status clear   Chest clear   Heart normal sinus rhythm, no murmur/gallop/rubs   Abdomen gravid, non-tender   Back no CVAT   Edema no edema   Clonus negative   Pelvic cervix closed and thick, no blood seen, nitrazine negative   Mebranes Intact   FHT normal rate with no decels   Ucx absent   Skin no lesions   Lymph no lymphadenopathy   Other BPs WNL, orthostatics negative. After 6 juice cups and a full meal, blood glucose (finger stick) was 98.   Impression:  Impression hypoglycemia   Plan:  Plan discharge   Comments Patient to keep Tuesday  appointment. Will refer her to mental health professional to help with stress and emotional management. 0.5mg  Lorazepam po prescribed to patient for anxiolytic if panic attacs recur. Encouraged increasing dietary intake, monitor blood sugar PRN.   Follow Up Appointment already scheduled   Electronic Signatures: Trentham, Elenora Fenderhelsea C (MD)  (Signed 23-Oct-15 14:49)  Authored: L&D Evaluation   Last Updated: 23-Oct-15 14:49 by Routson, Elenora Fenderhelsea C (MD)

## 2014-11-29 NOTE — H&P (Signed)
L&D Evaluation:  History:  HPI 24 yo G3P1011 WF @ 40.0 by LMP c/w 1st trimester US with EDC of 09/03/14 presents to L&D with contractions and cervica change.  Patient was observed until early this morning in triage with contractions but no cervical change - remained at 3.  Upon presentation now she is 4-5 cm per nursing exam.  Contracting q1083min.  Pregnancy issues: - FOB had Marfan's disease and passed away during this pregnancy. has been on anti-anxiety meds since 24 weeks -patient declined fetal ECHO during pregnancy -Vit D and B12 deficient -obesity BMI 33 -close interval pregnancy -A+/VI/ RNI/ GBS neg -desires nexplanon for contraception post delivery   Presents with leaking fluid   Patient's Medical History Anxiety, Anemia, obesity, vitamin B12 and D deficiencies,   Patient's Surgical History none   Medications Pre Natal Vitamins  magnesium 500 mgm daily, and Zoloft 25 mg, Ativan 0.5mg    Allergies NKDA   Social History none   Family History Non-Contributory   ROS:  ROS All systems were reviewed.  HEENT, CNS, GI, GU, Respiratory, CV, Renal and Musculoskeletal systems were found to be normal.   Exam:  Vital Signs stable   Urine Protein not completed   General no apparent distress   Mental Status clear   Chest clear   Heart normal sinus rhythm   Abdomen gravid, non-tender   Estimated Fetal Weight Average for gestational age   Fetal Position cephalic per nursing exam   Back no CVAT   Edema no edema   Reflexes 2+   Clonus negative   Pelvic 4cm / 70% / -2   Mebranes Intact   FHT normal rate with no decels, 125 mod +accels no decels   Ucx regular, q313min   Skin dry   Lymph no lymphadenopathy   Impression:  Impression active labor   Plan:  Comments 24yo G3P1011 @ 40.0 with labor  - admit to L&D - IVF - light diet - expectant management - continuous EFM/TOCO - patient would prefer no epidural, IV pain meds PRN   Electronic  Signatures: Schwartzman, Elenora Fenderhelsea C (MD)  (Signed 14-Feb-16 10:05)  Authored: L&D Evaluation   Last Updated: 14-Feb-16 10:05 by Geary, Elenora Fenderhelsea C (MD)

## 2017-08-11 ENCOUNTER — Telehealth: Payer: Self-pay | Admitting: Obstetrics and Gynecology

## 2017-08-11 NOTE — Telephone Encounter (Signed)
Pt is schedule 10/08/17 with [facility]. Pt is up to date on Annual with PCP. Pt Advised to come to the office and file out medical release form for us to have Annual notes.

## 2017-09-16 NOTE — Telephone Encounter (Signed)
Noted will order to arrive by apt date/time. °

## 2017-10-06 NOTE — Telephone Encounter (Signed)
Nexplanon reserved for this patient. 

## 2017-10-08 ENCOUNTER — Ambulatory Visit: Payer: Self-pay | Admitting: Obstetrics and Gynecology

## 2017-10-10 NOTE — Telephone Encounter (Signed)
10/09/17 apt was cancelled d/t insurance inactive. Device will need to be reserved again if patient reschedules.

## 2019-08-17 ENCOUNTER — Other Ambulatory Visit: Payer: Self-pay

## 2019-08-17 ENCOUNTER — Encounter: Payer: Self-pay | Admitting: Emergency Medicine

## 2019-08-17 ENCOUNTER — Ambulatory Visit
Admission: EM | Admit: 2019-08-17 | Discharge: 2019-08-17 | Disposition: A | Discharge status: Home / Self Care | Payer: 59 | Attending: Urgent Care | Admitting: Urgent Care

## 2019-08-17 DIAGNOSIS — B349 Viral infection, unspecified: Secondary | ICD-10-CM | POA: Insufficient documentation

## 2019-08-17 DIAGNOSIS — R5383 Other fatigue: Secondary | ICD-10-CM

## 2019-08-17 DIAGNOSIS — R509 Fever, unspecified: Secondary | ICD-10-CM

## 2019-08-17 DIAGNOSIS — Z20822 Contact with and (suspected) exposure to covid-19: Secondary | ICD-10-CM

## 2019-08-17 DIAGNOSIS — R112 Nausea with vomiting, unspecified: Secondary | ICD-10-CM | POA: Diagnosis not present

## 2019-08-17 DIAGNOSIS — U071 COVID-19: Secondary | ICD-10-CM | POA: Diagnosis not present

## 2019-08-17 DIAGNOSIS — Z7189 Other specified counseling: Secondary | ICD-10-CM | POA: Diagnosis present

## 2019-08-17 HISTORY — DX: Anxiety disorder, unspecified: F41.9

## 2019-08-17 HISTORY — DX: Iron deficiency: E61.1

## 2019-08-17 HISTORY — DX: Depression, unspecified: F32.A

## 2019-08-17 MED ORDER — ONDANSETRON 4 MG PO TBDP: 4.0000 mg | ORAL_TABLET | Freq: Three times a day (TID) | ORAL | 0 refills | Status: DC | PRN

## 2019-08-17 MED ORDER — BENZONATATE 100 MG PO CAPS: 100.0000 mg | ORAL_CAPSULE | Freq: Three times a day (TID) | ORAL | 0 refills | Status: DC

## 2019-08-17 NOTE — ED Triage Notes (Signed)
Patient c/o nausea, vomiting, headache, weakness, cough that started Thursday am/Friday.

## 2019-08-17 NOTE — ED Provider Notes (Signed)
[place], [place]   Name: Mary Nash DOB: [birth year] MRN: [id number] CSN: [id number] PCP: Remi Haggard, FNP  Arrival date and time:  08/17/19 0847  Chief Complaint:  Emesis, Headache, and Cough   NOTE: Prior to seeing the patient today, I have reviewed the triage nursing documentation and vital signs. Clinical staff has updated patient's PMH/PSHx, current medication list, and drug allergies/intolerances to ensure comprehensive history available to assist in medical decision making.   History:   HPI: Mary Nash is a 29 y.o. female who presents today with complaints of fatigue, cough, congestion, generalized weakness, and dizziness. Symptoms have been progressive over the last few days (Thursday/Friday). Patient confirms subjective fevers; Tmax unknown. Cough has been non-productive with no associated shortness of breath or wheezing. She reports that it burns in her throat/upper chest with deep inspiration. She has experienced any nausea and vomiting. Patient denies any diarrhea or abdominal pain. She is eating and drinking well. Patient denies any perceived alterations to her sense of taste or smell. Patient denies being in close contact with anyone known to be ill; no one else is her home has experienced a similar symptom constellation. She has never been tested for SARS-CoV-2 (novel coronavirus) in the past per her report. Patient has been vaccinated for influenza this season. In efforts to conservatively manage her symptoms at home, the patient notes that she has used Dayquil, which has not helped to improve her symptoms.    Past Medical History:  Diagnosis Date  . Anxiety   . Depression   . Iron deficiency     History reviewed. No pertinent surgical history.  History reviewed. No pertinent family history.  Social History   Tobacco Use  . Smoking status: Never Smoker  . Smokeless tobacco: Never Used  Substance Use Topics  . Alcohol use: Never  . Drug use: Never     There are no problems to display for this patient.   Home Medications:    No outpatient medications have been marked as taking for the 08/17/19 encounter [facility] Encounter).    Allergies:   Patient has no known allergies.  Review of Systems (ROS): Review of Systems  Constitutional: Positive for chills, fatigue and fever.  HENT: Positive for congestion. Negative for ear pain, postnasal drip, rhinorrhea, sinus pressure, sinus pain, sneezing, sore throat and trouble swallowing.   Eyes: Negative for pain, discharge and redness.  Respiratory: Positive for cough. Negative for chest tightness and shortness of breath.        (+) lower throat/chest burning with deep inspiration.   Cardiovascular: Negative for chest pain and palpitations.  Gastrointestinal: Positive for nausea and vomiting. Negative for abdominal pain and diarrhea.  Musculoskeletal: Negative for arthralgias, back pain, myalgias and neck pain.  Skin: Negative for color change, pallor and rash.  Allergic/Immunologic: Negative for environmental allergies.  Neurological: Positive for dizziness and weakness (generalized). Negative for syncope and headaches.  Hematological: Negative for adenopathy.     Vital Signs: Today's Vitals   08/17/19 0915 08/17/19 0916 08/17/19 0929 08/17/19 0948  BP:   (!) 130/100   Pulse:   94   Resp:   18   Temp:   98.5 F (36.9 C)   TempSrc:   Oral   SpO2:   99%   Weight:  248 lb (112.5 kg)    Height:  5\' 6"  (1.676 m)    PainSc: 2    2     Physical Exam: Physical Exam  Constitutional: She is  oriented to person, place, and time and well-developed, well-nourished, and in no distress.  HENT:  Head: Normocephalic and atraumatic.  Right Ear: Tympanic membrane normal.  Nose: Mucosal edema and rhinorrhea present. No sinus tenderness.  Mouth/Throat: Uvula is midline and mucous membranes are normal. Posterior oropharyngeal erythema (mild) present. No oropharyngeal exudate or posterior  oropharyngeal edema.  Eyes: Pupils are equal, round, and reactive to light.  Neck: No tracheal deviation present.  Cardiovascular: Normal rate, regular rhythm, normal heart sounds and intact distal pulses.  Pulmonary/Chest: Effort normal and breath sounds normal.  Abdominal: Soft. Normal appearance and bowel sounds are normal. She exhibits no distension. There is no abdominal tenderness. There is no CVA tenderness.  Musculoskeletal:     Cervical back: Normal range of motion and neck supple.  Lymphadenopathy:    She has no cervical adenopathy.  Neurological: She is alert and oriented to person, place, and time. Gait normal.  Skin: Skin is warm and dry. No rash noted. She is not diaphoretic.  Psychiatric: Mood, memory, affect and judgment normal.  Nursing note and vitals reviewed.   Urgent Care Treatments / Results:   Orders Placed This Encounter  Procedures  . Novel Coronavirus, NAA (Hosp order, Send-out to Ref Lab; TAT 18-24 hrs    LABS: PLEASE NOTE: all labs that were ordered this encounter are listed, however only abnormal results are displayed. Labs Reviewed  NOVEL CORONAVIRUS, NAA (HOSP ORDER, SEND-OUT TO REF LAB; TAT 18-24 HRS)    EKG: -None  RADIOLOGY: No results found.  PROCEDURES: Procedures  MEDICATIONS RECEIVED THIS VISIT: Medications - No data to display  PERTINENT CLINICAL COURSE NOTES/UPDATES:   Initial Impression / Assessment and Plan / Urgent Care Course:  Pertinent labs & imaging results that were available during my care of the patient were personally reviewed by me and considered in my medical decision making (see lab/imaging section of note for values and interpretations).  Mary Nash is a 29 y.o. female who presents to [facility] today with complaints of Emesis, Headache, and Cough  Patient overall well appearing and in no acute distress today in clinic. Presenting symptoms (see HPI) and exam as documented above. She presents with  symptoms associated with SARS-CoV-2 (novel coronavirus). Discussed typical symptom constellation. Reviewed potential for infection and need for testing. Patient amenable to being tested. SARS-CoV-2 swab collected by certified clinical staff. Discussed variable turn around times associated with testing, as swabs are being processed at [facility], and have been taking between 24-48 hours to come back. She was advised to self quarantine, per [facility] DHHS guidelines, until negative results received. These measures are being implemented out of an abundance of caution to prevent transmission and spread during the current SARS-CoV-2 pandemic.  Presenting symptoms consistent with acute viral illness. Until ruled out with confirmatory lab testing, SARS-CoV-2 remains part of the differential. Her testing is pending at this time. I discussed with her that her symptoms are felt to be viral in nature, thus antibiotics would not offer her any relief or improve his symptoms any faster than conservative symptomatic management. Discussed supportive care measures at home during acute phase of illness. Patient to rest as much as possible. She was encouraged to ensure adequate hydration (water and ORS) to prevent dehydration and electrolyte derangements. Will send in Rx for a supply of ondansetron for patient to use on a PRN basis. Patient may use APAP and/or IBU on an as needed basis for pain/fever. Will also send in a supply of benzonatate (  Tessalon) for patient to use on a PRN basis to help with her cough.   Current clinical condition warrants patient being out of work in order to quarantine while waiting for testing results. She was provided with the appropriate documentation to provide to her place of employment that will allow for her to RTW on 08/19/2019 with no restrictions. RTW is contingent on her SARS-CoV-2 test results being reviewed as negative.     Discussed follow up with primary care physician in 1 week for re-evaluation.  I have reviewed the follow up and strict return precautions for any new or worsening symptoms. Patient is aware of symptoms that would be deemed urgent/emergent, and would thus require further evaluation either here or in the emergency department. At the time of discharge, she verbalized understanding and consent with the discharge plan as it was reviewed with her. All questions were fielded by provider and/or clinic staff prior to patient discharge.    Final Clinical Impressions / Urgent Care Diagnoses:   Final diagnoses:  Viral illness  Nausea and vomiting, intractability of vomiting not specified, unspecified vomiting type  Encounter for laboratory testing for COVID-19 virus  Advice given about COVID-19 virus infection    New Prescriptions:  [place] Controlled Substance Registry consulted? Not Applicable  Meds ordered this encounter  Medications  . ondansetron (ZOFRAN-ODT) 4 MG disintegrating tablet    Sig: Take 1 tablet (4 mg total) by mouth every 8 (eight) hours as needed.    Dispense:  15 tablet    Refill:  0  . benzonatate (TESSALON) 100 MG capsule    Sig: Take 1 capsule (100 mg total) by mouth every 8 (eight) hours.    Dispense:  21 capsule    Refill:  0    Recommended Follow up Care:  Patient encouraged to follow up with the following provider within the specified time frame, or sooner as dictated by the severity of her symptoms. As always, she was instructed that for any urgent/emergent care needs, she should seek care either here or in the emergency department for more immediate evaluation.  Follow-up Information    Armando Gang, FNP In 1 week.   Specialty: Family Medicine Why: General reassessment of symptoms if not improving Contact information: [address] [place] [address] [phone]         NOTE: This note was prepared using Dragon dictation software along with smaller phrase technology. Despite my best ability to proofread, there is the potential  that transcriptional errors may still occur from this process, and are completely unintentional.    Verlee Monte, NP 08/17/19 1149

## 2019-08-17 NOTE — Discharge Instructions (Addendum)
It was very nice seeing you today in clinic. Thank you for entrusting me with your care.   Rest and stay HYDRATED. Water and electrolyte containing beverages (Gatorade, Pedialyte) are best to prevent dehydration and electrolyte abnormalities. Tylenol and Motrin as needed for pain/fever. Use cough and nausea medication as needed; prescriptions have been sent to your pharmacy.   You were tested for SARS-CoV-2 (novel coronavirus) today. Testing is performed by an outside lab ([facility]) and has variable turn around times ranging between 24-48 hours. Current recommendations from the the CDC and [place] DHHS require that you remain out of work in order to quarantine at home until negative test results are have been received. In the event that your test results are positive, you will be contacted with further directives. These measures are being implemented out of an abundance of caution to prevent transmission and spread during the current SARS-CoV-2 pandemic.  Make arrangements to follow up with your regular doctor in 1 week for re-evaluation if not improving. If your symptoms/condition worsens, please seek follow up care either here or in the ER. Please remember, our [facility] providers are "right here with you" when you need [place].   Again, it was my pleasure to take care of you today. Thank you for choosing our clinic. I hope that you start to feel better quickly.   Quentin Mulling, MSN, APRN, FNP-C, CEN Advanced Practice Provider [facility]

## 2019-08-18 LAB — NOVEL CORONAVIRUS, NAA (HOSP ORDER, SEND-OUT TO REF LAB; TAT 18-24 HRS): SARS-CoV-2, NAA: DETECTED — AB

## 2019-08-20 ENCOUNTER — Other Ambulatory Visit: Payer: Self-pay

## 2019-08-20 ENCOUNTER — Emergency Department: Payer: 59

## 2019-08-20 ENCOUNTER — Encounter: Payer: Self-pay | Admitting: Emergency Medicine

## 2019-08-20 ENCOUNTER — Emergency Department
Admission: EM | Admit: 2019-08-20 | Discharge: 2019-08-20 | Disposition: A | Discharge status: Home / Self Care | Payer: 59 | Attending: Emergency Medicine | Admitting: Emergency Medicine

## 2019-08-20 DIAGNOSIS — R059 Cough, unspecified: Secondary | ICD-10-CM

## 2019-08-20 DIAGNOSIS — U071 COVID-19: Secondary | ICD-10-CM | POA: Diagnosis not present

## 2019-08-20 DIAGNOSIS — R05 Cough: Secondary | ICD-10-CM

## 2019-08-20 DIAGNOSIS — E860 Dehydration: Secondary | ICD-10-CM | POA: Diagnosis not present

## 2019-08-20 LAB — CBC
HCT: 44.6 % (ref 36.0–46.0)
Hemoglobin: 14.9 g/dL (ref 12.0–15.0)
MCH: 31.8 pg (ref 26.0–34.0)
MCHC: 33.4 g/dL (ref 30.0–36.0)
MCV: 95.3 fL (ref 80.0–100.0)
Platelets: 232 10*3/uL (ref 150–400)
RBC: 4.68 MIL/uL (ref 3.87–5.11)
RDW: 12 % (ref 11.5–15.5)
WBC: 4 10*3/uL (ref 4.0–10.5)
nRBC: 0 % (ref 0.0–0.2)

## 2019-08-20 LAB — COMPREHENSIVE METABOLIC PANEL
ALT: 39 U/L (ref 0–44)
AST: 45 U/L — ABNORMAL HIGH (ref 15–41)
Albumin: 3.5 g/dL (ref 3.5–5.0)
Alkaline Phosphatase: 51 U/L (ref 38–126)
Anion gap: 7 (ref 5–15)
BUN: 11 mg/dL (ref 6–20)
CO2: 24 mmol/L (ref 22–32)
Calcium: 8.5 mg/dL — ABNORMAL LOW (ref 8.9–10.3)
Chloride: 106 mmol/L (ref 98–111)
Creatinine, Ser: 0.8 mg/dL (ref 0.44–1.00)
GFR calc Af Amer: >60 mL/min (ref >60)
GFR calc non Af Amer: >60 mL/min (ref >60)
Glucose, Bld: 128 mg/dL — ABNORMAL HIGH (ref 70–99)
Potassium: 3.5 mmol/L (ref 3.5–5.1)
Sodium: 137 mmol/L (ref 135–145)
Total Bilirubin: 0.4 mg/dL (ref 0.3–1.2)
Total Protein: 7.1 g/dL (ref 6.5–8.1)

## 2019-08-20 LAB — TROPONIN I (HIGH SENSITIVITY)
Troponin I (High Sensitivity): 4 ng/L (ref <18)
Troponin I (High Sensitivity): 4 ng/L (ref <18)

## 2019-08-20 MED ORDER — HYDROCODONE-HOMATROPINE 5-1.5 MG/5ML PO SYRP: 5.0000 mL | ORAL_SOLUTION | Freq: Four times a day (QID) | ORAL | 0 refills | Status: DC | PRN

## 2019-08-20 MED ORDER — AZITHROMYCIN 250 MG PO TABS: ORAL_TABLET | ORAL | 0 refills | Status: DC

## 2019-08-20 MED ORDER — DEXAMETHASONE SODIUM PHOSPHATE 10 MG/ML IJ SOLN
10.0000 mg | Freq: Once | INTRAMUSCULAR | Status: AC
  Administered 2019-08-20: 17:00:00 10 mg via INTRAVENOUS
  Filled 2019-08-20: qty 1

## 2019-08-20 MED ORDER — PROMETHAZINE HCL 25 MG/ML IJ SOLN
25.0000 mg | Freq: Once | INTRAMUSCULAR | Status: AC
  Administered 2019-08-20: 17:00:00 25 mg via INTRAVENOUS
  Filled 2019-08-20: qty 1

## 2019-08-20 MED ORDER — SODIUM CHLORIDE 0.9 % IV BOLUS
1000.0000 mL | Freq: Once | INTRAVENOUS | Status: AC
  Administered 2019-08-20: 17:00:00 1000 mL via INTRAVENOUS

## 2019-08-20 MED ORDER — PROMETHAZINE HCL 25 MG PO TABS: 25.0000 mg | ORAL_TABLET | Freq: Three times a day (TID) | ORAL | 0 refills | Status: DC | PRN

## 2019-08-20 MED ORDER — ALBUTEROL SULFATE HFA 108 (90 BASE) MCG/ACT IN AERS
2.0000 | INHALATION_SPRAY | Freq: Once | RESPIRATORY_TRACT | Status: AC
  Administered 2019-08-20: 2 via RESPIRATORY_TRACT
  Filled 2019-08-20: qty 6.7

## 2019-08-20 MED ORDER — PREDNISONE 20 MG PO TABS: 40.0000 mg | ORAL_TABLET | Freq: Every day | ORAL | 0 refills | Status: AC

## 2019-08-20 NOTE — ED Triage Notes (Signed)
Pt to ER states she was confirmed CoVid + on Tuesday.  Pt reports increased SHOB starting last night.  Pt c/o being hot and "like my blood sugar is dropping". Pt states history of hypoglycemia, but not diabetes.

## 2019-08-20 NOTE — ED Notes (Signed)
Pt states she has never used inhaler before. Education provided on proper use. Pt returns demonstration correctly.

## 2019-08-20 NOTE — ED Provider Notes (Signed)
[facility] Emergency Department Provider Note  ____________________________________________   First MD Initiated Contact with Patient 08/20/19 1552     (approximate)  I have reviewed the triage vital signs and the nursing notes.   HISTORY  Chief Complaint Shortness of Breath    HPI Mary Nash is a 29 y.o. female  Here with SOB, fatigue. Pt was recently diagnosed with COVID-19. She's had associated fatigue, chills, nausea, poor PO intake, and chest pain that is worse w/ coughing and moving. She has been taking OTC meds without relief. Reports her sx have been constant and progressively worsening since onset. She states she has had difficulty eating/drinking enough due to loss of appetite. No leg swelling. No h/o DVT/PE. No h/o CAD. No sputum production.        Past Medical History:  Diagnosis Date  . Anxiety   . Depression   . Iron deficiency     There are no problems to display for this patient.   History reviewed. No pertinent surgical history.  Prior to Admission medications   Medication Sig Start Date End Date Taking? Authorizing Provider  benzonatate (TESSALON) 100 MG capsule Take 1 capsule (100 mg total) by mouth every 8 (eight) hours. 08/17/19   Verlee Monte, NP  HYDROcodone-homatropine [facility]) 5-1.5 MG/5ML syrup Take 5 mLs by mouth every 6 (six) hours as needed for cough. 08/20/19   Shaune Pollack, MD  ondansetron (ZOFRAN-ODT) 4 MG disintegrating tablet Take 1 tablet (4 mg total) by mouth every 8 (eight) hours as needed. 08/17/19   Verlee Monte, NP  predniSONE (DELTASONE) 20 MG tablet Take 2 tablets (40 mg total) by mouth daily for 5 days. 08/20/19 08/25/19  Shaune Pollack, MD  promethazine (PHENERGAN) 25 MG tablet Take 1 tablet (25 mg total) by mouth every 8 (eight) hours as needed for nausea or vomiting. 08/20/19   Shaune Pollack, MD    Allergies Patient has no known allergies.  History reviewed. No pertinent family  history.  Social History Social History   Tobacco Use  . Smoking status: Never Smoker  . Smokeless tobacco: Never Used  Substance Use Topics  . Alcohol use: Never  . Drug use: Never    Review of Systems  Review of Systems  Constitutional: Positive for chills, fatigue and fever.  HENT: Positive for congestion. Negative for sore throat.   Eyes: Negative for visual disturbance.  Respiratory: Positive for cough and shortness of breath.   Cardiovascular: Negative for chest pain.  Gastrointestinal: Positive for nausea. Negative for abdominal pain, diarrhea and vomiting.  Genitourinary: Negative for flank pain.  Musculoskeletal: Positive for arthralgias and myalgias. Negative for back pain and neck pain.  Skin: Negative for rash and wound.  Neurological: Positive for light-headedness. Negative for weakness.  All other systems reviewed and are negative.    ____________________________________________  PHYSICAL EXAM:      VITAL SIGNS: ED Triage Vitals  Enc Vitals Group     BP 08/20/19 1509 123/68     Pulse Rate 08/20/19 1509 89     Resp 08/20/19 1509 (!) 22     Temp 08/20/19 1509 98.9 F (37.2 C)     Temp Source 08/20/19 1509 Oral     SpO2 08/20/19 1509 96 %     Weight 08/20/19 1510 248 lb (112.5 kg)     Height 08/20/19 1510 5\' 6"  (1.676 m)     Head Circumference --      Peak Flow --  Pain Score 08/20/19 1510 6     Pain Loc --      Pain Edu? --      Excl. in [place]? --      Physical Exam Vitals and nursing note reviewed.  Constitutional:      General: She is not in acute distress.    Appearance: She is well-developed.  HENT:     Head: Normocephalic and atraumatic.     Mouth/Throat:     Pharynx: Oropharynx is clear.  Eyes:     Conjunctiva/sclera: Conjunctivae normal.  Cardiovascular:     Rate and Rhythm: Normal rate and regular rhythm.     Heart sounds: Normal heart sounds.  Pulmonary:     Effort: Pulmonary effort is normal. No respiratory distress.      Breath sounds: Examination of the right-middle field reveals rales. Examination of the left-middle field reveals rales. Examination of the right-lower field reveals rales. Examination of the left-lower field reveals rales. Rales present. No wheezing.  Chest:     Chest wall: Tenderness (TTP left lower anterior chest wall, no deformity) present.  Abdominal:     General: There is no distension.  Musculoskeletal:     Cervical back: Neck supple.  Skin:    General: Skin is warm.     Capillary Refill: Capillary refill takes less than 2 seconds.     Findings: No rash.  Neurological:     Mental Status: She is alert and oriented to person, place, and time.     Motor: No abnormal muscle tone.       ____________________________________________   LABS (all labs ordered are listed, but only abnormal results are displayed)  Labs Reviewed  COMPREHENSIVE METABOLIC PANEL - Abnormal; Notable for the following components:      Result Value   Glucose, Bld 128 (*)    Calcium 8.5 (*)    AST 45 (*)    All other components within normal limits  CBC  POC URINE PREG, ED  TROPONIN I (HIGH SENSITIVITY)  TROPONIN I (HIGH SENSITIVITY)    ____________________________________________  EKG: Normal sinus rhythm, VR 93. QRS 93, QTc 437. No acute ST elevations or depressions.  ________________________________________  RADIOLOGY All imaging, including plain films, CT scans, and ultrasounds, independently reviewed by me, and interpretations confirmed via formal radiology reads.  ED MD interpretation:   CXR: Bibasilar airspace disease c/w COVID-19  Official radiology report(s): DG Chest Portable 1 View  Result Date: 08/20/2019 CLINICAL DATA:  Cough, shortness of breath, COVID positive EXAM: PORTABLE CHEST 1 VIEW COMPARISON:  None. FINDINGS: The heart size and mediastinal contours are within normal limits. Subtle bibasilar heterogeneous airspace opacity, particularly in the left lung base. The visualized  skeletal structures are unremarkable. IMPRESSION: Subtle bibasilar heterogeneous airspace opacity, particularly in the left lung base, findings in keeping with reported COVID-19 diagnosis. Electronically Signed   By: Lauralyn Primes M.D.   On: 08/20/2019 16:51    ____________________________________________  PROCEDURES   Procedure(s) performed (including Critical Care):  Procedures  ____________________________________________  INITIAL IMPRESSION / MDM / ASSESSMENT AND PLAN / ED COURSE  As part of my medical decision making, I reviewed the following data within the electronic MEDICAL RECORD NUMBER Nursing notes reviewed and incorporated, Old chart reviewed, Notes from prior ED visits, and [place] Controlled Substance Database       *Mary Nash was evaluated in Emergency Department on 08/20/2019 for the symptoms described in the history of present illness. She was evaluated in the context of the  global COVID-19 pandemic, which necessitated consideration that the patient might be at risk for infection with the SARS-CoV-2 virus that causes COVID-19. Institutional protocols and algorithms that pertain to the evaluation of patients at risk for COVID-19 are in a state of rapid change based on information released by regulatory bodies including the CDC and federal and state organizations. These policies and algorithms were followed during the patient's care in the ED.  Some ED evaluations and interventions may be delayed as a result of limited staffing during the pandemic.*     Medical Decision Making:  29 yo F here with cough, SOB, nausea in setting of known COVID-19 diagnosis. Pt is afebrile, non-toxic here but does appear dehydrated. Pt given fluids, antiemetics w/ good effect. Lab work overall reassuring. EKG nonischemic, trop neg x 2 and doubt ACS, pericarditis, myocarditis. No signs of PE clinically and she is not hypoxic or tachycardic. Renal function wnl despite her poor PO intake - will encourage  ongoing PO hydration. CXR is c/w COVID-19. Given severity of her sx, will add on steroids/azithro, and continue antiemetics and supportive care. No indication for hospitalization or remdesevir at this time.  ____________________________________________  FINAL CLINICAL IMPRESSION(S) / ED DIAGNOSES  Final diagnoses:  COVID-19  Cough  Dehydration     MEDICATIONS GIVEN DURING THIS VISIT:  Medications  sodium chloride 0.9 % bolus 1,000 mL (0 mLs Intravenous Stopped 08/20/19 1829)  promethazine (PHENERGAN) injection 25 mg (25 mg Intravenous Given 08/20/19 1700)  dexamethasone (DECADRON) injection 10 mg (10 mg Intravenous Given 08/20/19 1700)  albuterol (VENTOLIN HFA) 108 (90 Base) MCG/ACT inhaler 2 puff (2 puffs Inhalation Given 08/20/19 1700)     ED Discharge Orders         Ordered    HYDROcodone-homatropine (HYCODAN) 5-1.5 MG/5ML syrup  Every 6 hours PRN     08/20/19 1925    predniSONE (DELTASONE) 20 MG tablet  Daily     08/20/19 1925    promethazine (PHENERGAN) 25 MG tablet  Every 8 hours PRN     08/20/19 1925           Note:  This document was prepared using Dragon voice recognition software and may include unintentional dictation errors.   Duffy Bruce, MD 08/20/19 2129

## 2019-10-13 ENCOUNTER — Encounter: Payer: Self-pay | Admitting: Emergency Medicine

## 2019-10-13 ENCOUNTER — Other Ambulatory Visit: Payer: Self-pay

## 2019-10-13 ENCOUNTER — Emergency Department
Admission: EM | Admit: 2019-10-13 | Discharge: 2019-10-13 | Disposition: A | Discharge status: Home / Self Care | Payer: 59 | Attending: Emergency Medicine | Admitting: Emergency Medicine

## 2019-10-13 ENCOUNTER — Emergency Department: Payer: 59

## 2019-10-13 DIAGNOSIS — Y990 Civilian activity done for income or pay: Secondary | ICD-10-CM | POA: Diagnosis not present

## 2019-10-13 DIAGNOSIS — X500XXA Overexertion from strenuous movement or load, initial encounter: Secondary | ICD-10-CM | POA: Diagnosis not present

## 2019-10-13 DIAGNOSIS — Y929 Unspecified place or not applicable: Secondary | ICD-10-CM | POA: Diagnosis not present

## 2019-10-13 DIAGNOSIS — K8020 Calculus of gallbladder without cholecystitis without obstruction: Secondary | ICD-10-CM | POA: Insufficient documentation

## 2019-10-13 DIAGNOSIS — S39011A Strain of muscle, fascia and tendon of abdomen, initial encounter: Secondary | ICD-10-CM | POA: Insufficient documentation

## 2019-10-13 DIAGNOSIS — Y939 Activity, unspecified: Secondary | ICD-10-CM | POA: Diagnosis not present

## 2019-10-13 DIAGNOSIS — S3991XA Unspecified injury of abdomen, initial encounter: Secondary | ICD-10-CM | POA: Diagnosis present

## 2019-10-13 LAB — CBC
HCT: 39.4 % (ref 36.0–46.0)
Hemoglobin: 13 g/dL (ref 12.0–15.0)
MCH: 32.9 pg (ref 26.0–34.0)
MCHC: 33 g/dL (ref 30.0–36.0)
MCV: 99.7 fL (ref 80.0–100.0)
Platelets: 426 10*3/uL — ABNORMAL HIGH (ref 150–400)
RBC: 3.95 MIL/uL (ref 3.87–5.11)
RDW: 12.7 % (ref 11.5–15.5)
WBC: 7.6 10*3/uL (ref 4.0–10.5)
nRBC: 0 % (ref 0.0–0.2)

## 2019-10-13 LAB — COMPREHENSIVE METABOLIC PANEL
ALT: 22 U/L (ref 0–44)
AST: 21 U/L (ref 15–41)
Albumin: 4.1 g/dL (ref 3.5–5.0)
Alkaline Phosphatase: 49 U/L (ref 38–126)
Anion gap: 6 (ref 5–15)
BUN: 15 mg/dL (ref 6–20)
CO2: 28 mmol/L (ref 22–32)
Calcium: 9.4 mg/dL (ref 8.9–10.3)
Chloride: 107 mmol/L (ref 98–111)
Creatinine, Ser: 0.63 mg/dL (ref 0.44–1.00)
GFR calc Af Amer: >60 mL/min (ref >60)
GFR calc non Af Amer: >60 mL/min (ref >60)
Glucose, Bld: 86 mg/dL (ref 70–99)
Potassium: 4.1 mmol/L (ref 3.5–5.1)
Sodium: 141 mmol/L (ref 135–145)
Total Bilirubin: 0.4 mg/dL (ref 0.3–1.2)
Total Protein: 7.4 g/dL (ref 6.5–8.1)

## 2019-10-13 LAB — URINALYSIS, COMPLETE (UACMP) WITH MICROSCOPIC
Bilirubin Urine: NEGATIVE
Glucose, UA: NEGATIVE mg/dL
Ketones, ur: NEGATIVE mg/dL
Nitrite: NEGATIVE
Protein, ur: NEGATIVE mg/dL
Specific Gravity, Urine: 1.021 (ref 1.005–1.030)
pH: 6 (ref 5.0–8.0)

## 2019-10-13 LAB — LIPASE, BLOOD: Lipase: 35 U/L (ref 11–51)

## 2019-10-13 LAB — POCT PREGNANCY, URINE: Preg Test, Ur: NEGATIVE

## 2019-10-13 MED ORDER — SODIUM CHLORIDE 0.9% FLUSH: 3.0000 mL | Freq: Once | INTRAVENOUS | Status: DC

## 2019-10-13 MED ORDER — LIDOCAINE 5 % EX PTCH: 1.0000 | MEDICATED_PATCH | Freq: Two times a day (BID) | CUTANEOUS | 0 refills | Status: DC

## 2019-10-13 NOTE — ED Notes (Signed)
Pt PO challenged with water and saltine crackers (water per pt request rather than ginger ale)

## 2019-10-13 NOTE — ED Triage Notes (Signed)
Pt c/o RLQ abdominal pain and cramping x 5 days. Pt states sent by PCP due to concerns of ectopic pregnancy. Pt denies positive pregnancy test. Pt reports just finished period, reports normally does not have periods due to her birth control, reports clots noted over the last 5 days. Pt states PCP also wants her ruled out for gallbladder issues, pain is in RLQ.

## 2019-10-13 NOTE — ED Notes (Signed)
Pt left after discussing discharge with provider. Pt paperwork and prescriptions will be left at Nurse first desk.

## 2019-10-13 NOTE — ED Notes (Signed)
Rainbow sent to the lab.  

## 2019-10-13 NOTE — ED Provider Notes (Signed)
[facility] Emergency Department Provider Note  ____________________________________________  Time seen: Approximately 8:11 PM  I have reviewed the triage vital signs and the nursing notes.   HISTORY  Chief Complaint Abdominal Pain    HPI Mary Nash is a 29 y.o. female with a history of anxiety, depression, cholelithiasis who comes the ED complaining of right-sided abdominal pain, crampy for the past 5 days, nonradiating, worse with movement, no alleviating factors, denies fevers chills vomiting diarrhea constipation or dysuria.  Does note that about a week ago she had some irregular vaginal bleeding that seemed heavy and she was worried about having a miscarriage.  She had no known pregnancy.  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      Past Medical History:  Diagnosis Date  . Anxiety   . Depression   . Iron deficiency      There are no problems to display for this patient.    History reviewed. No pertinent surgical history.   Prior to Admission medications   Medication Sig Start Date End Date Taking? Authorizing Provider  azithromycin (ZITHROMAX Z-PAK) 250 MG tablet Take 2 tablets (500 mg) on  Day 1,  followed by 1 tablet (250 mg) once daily on Days 2 through 5. 08/20/19   Shaune Pollack, MD  benzonatate (TESSALON) 100 MG capsule Take 1 capsule (100 mg total) by mouth every 8 (eight) hours. 08/17/19   Verlee Monte, NP  HYDROcodone-homatropine [facility]) 5-1.5 MG/5ML syrup Take 5 mLs by mouth every 6 (six) hours as needed for cough. 08/20/19   Shaune Pollack, MD  lidocaine (LIDODERM) 5 % Place 1 patch onto the skin every 12 (twelve) hours. Remove & Discard patch within 12 hours or as directed by MD 10/13/19   Sharman Cheek, MD  ondansetron (ZOFRAN-ODT) 4 MG disintegrating tablet Take 1 tablet (4 mg total) by mouth every 8 (eight) hours as needed. 08/17/19   Verlee Monte, NP   promethazine (PHENERGAN) 25 MG tablet Take 1 tablet (25 mg total) by mouth every 8 (eight) hours as needed for nausea or vomiting. 08/20/19   Shaune Pollack, MD     Allergies Patient has no known allergies.   History reviewed. No pertinent family history.  Social History Social History   Tobacco Use  . Smoking status: Never Smoker  . Smokeless tobacco: Never Used  Substance Use Topics  . Alcohol use: Never  . Drug use: Never    Review of Systems  Constitutional:   No fever or chills.  ENT:   No sore throat. No rhinorrhea. Cardiovascular:   No chest pain or syncope. Respiratory:   No dyspnea or cough. Gastrointestinal:   Right-sided abdominal pain as above without vomiting and diarrhea.  Musculoskeletal:   Negative for focal pain or swelling All other systems reviewed and are negative except as documented above in ROS and HPI.  ____________________________________________   PHYSICAL EXAM:  VITAL SIGNS: ED Triage Vitals  Enc Vitals Group     BP 10/13/19 1659 (!) 152/84     Pulse Rate 10/13/19 1659 (!) 102     Resp 10/13/19 1659 (!) 22     Temp 10/13/19 1659 98.5 F (36.9 C)     Temp Source 10/13/19 1659 Oral     SpO2 10/13/19 1659 100 %     Weight 10/13/19 1657 244 lb (110.7 kg)     Height 10/13/19 1657 5\' 4"  (1.626 m)  Head Circumference --      Peak Flow --      Pain Score 10/13/19 1657 4     Pain Loc --      Pain Edu? --      Excl. in [place]? --     Vital signs reviewed, nursing assessments reviewed.   Constitutional:   Alert and oriented. Non-toxic appearance. Eyes:   Conjunctivae are normal. EOMI. PERRL. ENT      Head:   Normocephalic and atraumatic.      Nose:   Wearing a mask.      Mouth/Throat:   Wearing a mask.      Neck:   No meningismus. Full ROM. Hematological/Lymphatic/Immunilogical:   No cervical lymphadenopathy. Cardiovascular:   RRR. Symmetric bilateral radial and DP pulses.  No murmurs. Cap refill less than 2 seconds. Respiratory:    Normal respiratory effort without tachypnea/retractions. Breath sounds are clear and equal bilaterally. No wheezes/rales/rhonchi. Gastrointestinal:   Soft with focal right lateral abdominal wall tenderness over the oblique musculature.  No lower abdominal tenderness or tenderness in right upper quadrant, exam is otherwise benign.  Non distended. There is no CVA tenderness.  No rebound, rigidity, or guarding. Musculoskeletal:   Normal range of motion in all extremities. No joint effusions.  No lower extremity tenderness.  No edema. Neurologic:   Normal speech and language.  Motor grossly intact. No acute focal neurologic deficits are appreciated.  Skin:    Skin is warm, dry and intact. No rash noted.  No petechiae, purpura, or bullae.  ____________________________________________    LABS (pertinent positives/negatives) (all labs ordered are listed, but only abnormal results are displayed) Labs Reviewed  CBC - Abnormal; Notable for the following components:      Result Value   Platelets 426 (*)    All other components within normal limits  URINALYSIS, COMPLETE (UACMP) WITH MICROSCOPIC - Abnormal; Notable for the following components:   Color, Urine YELLOW (*)    APPearance HAZY (*)    Hgb urine dipstick SMALL (*)    Leukocytes,Ua SMALL (*)    Bacteria, UA RARE (*)    All other components within normal limits  LIPASE, BLOOD  COMPREHENSIVE METABOLIC PANEL  POC URINE PREG, ED  POCT PREGNANCY, URINE   ____________________________________________   EKG    ____________________________________________    RADIOLOGY  US ABDOMEN LIMITED RUQ  Result Date: 10/13/2019 CLINICAL DATA:  Right upper quadrant tenderness for 3 days EXAM: ULTRASOUND ABDOMEN LIMITED RIGHT UPPER QUADRANT COMPARISON:  12/18/2012 FINDINGS: Gallbladder: Shadowing non mobile gallstone within the gallbladder neck measures up to 1.4 cm in size. There is no gallbladder wall thickening or pericholecystic fluid. Negative  sonographic Murphy sign. Common bile duct: Diameter: 2 mm Liver: Increased liver echotexture consistent with fibrofatty infiltration. No focal abnormalities. Portal vein is patent on color Doppler imaging with normal direction of blood flow towards the liver. Other: None. IMPRESSION: 1. Nonmobile gallstone within the gallbladder neck. No evidence of acute cholecystitis. 2. Fatty infiltration of the liver. Electronically Signed   By: Randa Ngo M.D.   On: 10/13/2019 19:26    ____________________________________________   PROCEDURES Procedures  ____________________________________________  DIFFERENTIAL DIAGNOSIS  Abdominal wall strain, impacted gallstone, cholecystitis, intestinal gas   CLINICAL IMPRESSION / ASSESSMENT AND PLAN / ED COURSE  Medications ordered in the ED: Medications  sodium chloride flush (NS) 0.9 % injection 3 mL (has no administration in time range)    Pertinent labs & imaging results that were available during my care  of the patient were reviewed by me and considered in my medical decision making (see chart for details).  Mary Nash was evaluated in Emergency Department on 10/13/2019 for the symptoms described in the history of present illness. She was evaluated in the context of the global COVID-19 pandemic, which necessitated consideration that the patient might be at risk for infection with the SARS-CoV-2 virus that causes COVID-19. Institutional protocols and algorithms that pertain to the evaluation of patients at risk for COVID-19 are in a state of rapid change based on information released by regulatory bodies including the CDC and federal and state organizations. These policies and algorithms were followed during the patient's care in the ED.   Patient presents with right-sided abdominal pain, off and on for the past 5 days.  Pregnancy test is negative, labs are all unremarkable.  She has slight tachycardia at triage, but on exam she is calm and  comfortable, nontoxic.  Due to known gallstones, right upper quadrant ultrasound obtained which does show a sizable gallstone in the gallbladder neck, but she is tolerating oral intake and has otherwise reassuring ultrasound study.  Counseled her on bland diet, follow-up with surgery, she nods and notes that she has been told all of this before and that she knows she needs to arrange for elective cholecystectomy but she has a 74-year-old and a 21-year-old at home with no other childcare providers.  Return precautions discussed.      ____________________________________________   FINAL CLINICAL IMPRESSION(S) / ED DIAGNOSES    Final diagnoses:  Calculus of gallbladder without cholecystitis without obstruction  Abdominal wall strain, initial encounter     ED Discharge Orders         Ordered    lidocaine (LIDODERM) 5 %  Every 12 hours     10/13/19 2011          Portions of this note were generated with dragon dictation software. Dictation errors may occur despite best attempts at proofreading.   Sharman Cheek, MD 10/13/19 2015

## 2019-10-18 ENCOUNTER — Emergency Department
Admission: EM | Admit: 2019-10-18 | Discharge: 2019-10-18 | Disposition: A | Discharge status: Home / Self Care | Payer: 59 | Attending: Emergency Medicine | Admitting: Emergency Medicine

## 2019-10-18 ENCOUNTER — Other Ambulatory Visit: Payer: Self-pay

## 2019-10-18 ENCOUNTER — Emergency Department: Payer: 59

## 2019-10-18 ENCOUNTER — Encounter: Payer: Self-pay | Admitting: Emergency Medicine

## 2019-10-18 DIAGNOSIS — R109 Unspecified abdominal pain: Secondary | ICD-10-CM | POA: Diagnosis present

## 2019-10-18 DIAGNOSIS — K8020 Calculus of gallbladder without cholecystitis without obstruction: Secondary | ICD-10-CM | POA: Insufficient documentation

## 2019-10-18 LAB — COMPREHENSIVE METABOLIC PANEL
ALT: 22 U/L (ref 0–44)
AST: 22 U/L (ref 15–41)
Albumin: 4 g/dL (ref 3.5–5.0)
Alkaline Phosphatase: 54 U/L (ref 38–126)
Anion gap: 9 (ref 5–15)
BUN: 11 mg/dL (ref 6–20)
CO2: 23 mmol/L (ref 22–32)
Calcium: 8.9 mg/dL (ref 8.9–10.3)
Chloride: 106 mmol/L (ref 98–111)
Creatinine, Ser: 0.66 mg/dL (ref 0.44–1.00)
GFR calc Af Amer: >60 mL/min (ref >60)
GFR calc non Af Amer: >60 mL/min (ref >60)
Glucose, Bld: 98 mg/dL (ref 70–99)
Potassium: 4.1 mmol/L (ref 3.5–5.1)
Sodium: 138 mmol/L (ref 135–145)
Total Bilirubin: 0.6 mg/dL (ref 0.3–1.2)
Total Protein: 7.2 g/dL (ref 6.5–8.1)

## 2019-10-18 LAB — LIPASE, BLOOD: Lipase: 24 U/L (ref 11–51)

## 2019-10-18 LAB — URINALYSIS, COMPLETE (UACMP) WITH MICROSCOPIC
Bacteria, UA: NONE SEEN
Bilirubin Urine: NEGATIVE
Glucose, UA: NEGATIVE mg/dL
Ketones, ur: 20 mg/dL — AB
Nitrite: NEGATIVE
Protein, ur: NEGATIVE mg/dL
Specific Gravity, Urine: 1.024 (ref 1.005–1.030)
pH: 5 (ref 5.0–8.0)

## 2019-10-18 LAB — CBC
HCT: 38.6 % (ref 36.0–46.0)
Hemoglobin: 13.2 g/dL (ref 12.0–15.0)
MCH: 33.6 pg (ref 26.0–34.0)
MCHC: 34.2 g/dL (ref 30.0–36.0)
MCV: 98.2 fL (ref 80.0–100.0)
Platelets: 436 10*3/uL — ABNORMAL HIGH (ref 150–400)
RBC: 3.93 MIL/uL (ref 3.87–5.11)
RDW: 12.4 % (ref 11.5–15.5)
WBC: 6.8 10*3/uL (ref 4.0–10.5)
nRBC: 0 % (ref 0.0–0.2)

## 2019-10-18 LAB — POCT PREGNANCY, URINE: Preg Test, Ur: NEGATIVE

## 2019-10-18 NOTE — Discharge Instructions (Addendum)
You have a large gallstone in your gallbladder.  Please call general surgery tomorrow for a follow-up appointment.  Please also call primary care for a follow-up appointment

## 2019-10-18 NOTE — ED Notes (Signed)
See triage note  States she was dx'd with gallstones in past   States pain became worse this am

## 2019-10-18 NOTE — ED Triage Notes (Signed)
Pt in via POV, reports generalized abdominal pain w/ nausea x 1 day.  Pt with hx of gallstones.  NAD noted at this time.

## 2019-10-18 NOTE — ED Provider Notes (Signed)
[facility] Emergency Department Provider Note  ____________________________________________  Time seen: Approximately 1:55 PM  I have reviewed the triage vital signs and the nursing notes.   HISTORY  Chief Complaint Abdominal Pain    HPI Mary Nash is a 29 y.o. female that presents to the emergency department for evaluation of return of right upper quadrant pain and nausea today.  Patient was seen in this emergency department 6 days ago for similar symptoms. Right upper quadrant pain improved following her visit to the emergency department but returned today.   Patient states that she was told by her primary care that she should return to the emergency department if abdominal pain returns.  She has not yet followed up with a surgeon.  Patient states that she was diagnosed with a muscle strain in her right lower abdomen and this area is improving.   Patient states that she was reading a really good book when the ED provider talk to her about her gallstones so she does not remember most of the conversation.  No fevers, vomiting.   Past Medical History:  Diagnosis Date  . Anxiety   . Depression   . Iron deficiency     There are no problems to display for this patient.   History reviewed. No pertinent surgical history.  Prior to Admission medications   Not on File    Allergies Patient has no known allergies.  No family history on file.  Social History Social History   Tobacco Use  . Smoking status: Never Smoker  . Smokeless tobacco: Never Used  Substance Use Topics  . Alcohol use: Never  . Drug use: Never     Review of Systems  Constitutional: No fever/chills Cardiovascular: No chest pain. Respiratory:  No SOB. Gastrointestinal: Positive for abdominal pain. No nausea, no vomiting.  Musculoskeletal: Negative for musculoskeletal pain. Skin: Negative for rash, abrasions, lacerations, ecchymosis. Neurological: Negative for  headaches   ____________________________________________   PHYSICAL EXAM:  VITAL SIGNS: ED Triage Vitals  Enc Vitals Group     BP 10/18/19 1301 116/89     Pulse Rate 10/18/19 1301 75     Resp 10/18/19 1301 18     Temp 10/18/19 1301 98.1 F (36.7 C)     Temp Source 10/18/19 1301 Oral     SpO2 10/18/19 1301 99 %     Weight 10/18/19 1302 244 lb (110.7 kg)     Height 10/18/19 1302 5\' 6"  (1.676 m)     Head Circumference --      Peak Flow --      Pain Score 10/18/19 1306 8     Pain Loc --      Pain Edu? --      Excl. in [place]? --      Constitutional: Alert and oriented. Well appearing and in no acute distress. Eyes: Conjunctivae are normal. PERRL. EOMI. Head: Atraumatic. ENT:      Ears:      Nose: No congestion/rhinnorhea.      Mouth/Throat: Mucous membranes are moist.  Neck: No stridor. Cardiovascular: Normal rate, regular rhythm.  Good peripheral circulation. Respiratory: Normal respiratory effort without tachypnea or retractions. Lungs CTAB. Good air entry to the bases with no decreased or absent breath sounds. Gastrointestinal: Bowel sounds 4 quadrants.  Mild right upper quadrant tenderness.  No guarding or rigidity. No palpable masses. No distention. No CVA tenderness. Musculoskeletal: Full range of motion to all extremities. No gross deformities appreciated. Neurologic:  Normal speech and  language. No gross focal neurologic deficits are appreciated.  Skin:  Skin is warm, dry and intact. No rash noted. Psychiatric: Mood and affect are normal. Speech and behavior are normal. Patient exhibits appropriate insight and judgement.   ____________________________________________   LABS (all labs ordered are listed, but only abnormal results are displayed)  Labs Reviewed  CBC - Abnormal; Notable for the following components:      Result Value   Platelets 436 (*)    All other components within normal limits  URINALYSIS, COMPLETE (UACMP) WITH MICROSCOPIC - Abnormal; Notable  for the following components:   Color, Urine YELLOW (*)    APPearance HAZY (*)    Hgb urine dipstick LARGE (*)    Ketones, ur 20 (*)    Leukocytes,Ua SMALL (*)    All other components within normal limits  LIPASE, BLOOD  COMPREHENSIVE METABOLIC PANEL  POC URINE PREG, ED  POCT PREGNANCY, URINE   ____________________________________________  EKG   ____________________________________________  RADIOLOGY   US Abdomen Limited RUQ  Result Date: 10/18/2019 CLINICAL DATA:  Right upper quadrant pain EXAM: ULTRASOUND ABDOMEN LIMITED RIGHT UPPER QUADRANT COMPARISON:  Ultrasound 10/13/2019 FINDINGS: Gallbladder: 1.5 cm stone at the gallbladder neck. Normal wall thickness. Negative sonographic Murphy. Common bile duct: Diameter: 4 mm Liver: Liver appears slightly echogenic. No focal hepatic abnormality. Portal vein is patent on color Doppler imaging with normal direction of blood flow towards the liver. Other: None. IMPRESSION: 1. Cholelithiasis without sonographic evidence for acute cholecystitis. 2. Liver appears slightly echogenic as may be seen with steatosis. Electronically Signed   By: Jasmine Pang M.D.   On: 10/18/2019 15:41    ____________________________________________    PROCEDURES  Procedure(s) performed:    Procedures    Medications - No data to display   ____________________________________________   INITIAL IMPRESSION / ASSESSMENT AND PLAN / ED COURSE  Pertinent labs & imaging results that were available during my care of the patient were reviewed by me and considered in my medical decision making (see chart for details).  Review of the [place] CSRS was performed in accordance of the [facility] prior to dispensing any controlled drugs.   Patient's diagnosis is consistent with cholelithiasis without cholecystitis.  Vital signs and exam are reassuring.  Lab work is largely unremarkable.  Ultrasound consistent with cholelithiasis without evidence of acute cholecystitis.   Patient was encouraged to follow-up with general surgery.  Patient is to follow up with general surgery as directed.  Referral was given.  Patient is given ED precautions to return to the ED for any worsening or new symptoms.  Mary Nash was evaluated in Emergency Department on 10/18/2019 for the symptoms described in the history of present illness. She was evaluated in the context of the global COVID-19 pandemic, which necessitated consideration that the patient might be at risk for infection with the SARS-CoV-2 virus that causes COVID-19. Institutional protocols and algorithms that pertain to the evaluation of patients at risk for COVID-19 are in a state of rapid change based on information released by regulatory bodies including the CDC and federal and state organizations. These policies and algorithms were followed during the patient's care in the ED.  ____________________________________________  FINAL CLINICAL IMPRESSION(S) / ED DIAGNOSES  Final diagnoses:  Abdominal pain  Calculus of gallbladder without cholecystitis without obstruction      NEW MEDICATIONS STARTED DURING THIS VISIT:  ED Discharge Orders    None          This chart was dictated using voice  recognition software/Dragon. Despite best efforts to proofread, errors can occur which can change the meaning. Any change was purely unintentional.    Enid Derry, PA-C 10/18/19 1625    Minna Antis, MD 10/19/19 1344

## 2019-10-19 ENCOUNTER — Encounter: Payer: Self-pay | Admitting: General Surgery

## 2019-10-19 ENCOUNTER — Ambulatory Visit (INDEPENDENT_AMBULATORY_CARE_PROVIDER_SITE_OTHER): Payer: 59 | Admitting: General Surgery

## 2019-10-19 ENCOUNTER — Other Ambulatory Visit: Payer: Self-pay

## 2019-10-19 VITALS — BP 113/74 | HR 96 | Temp 97.7°F | Resp 12 | Ht 66.0 in | Wt 247.4 lb

## 2019-10-19 DIAGNOSIS — K8020 Calculus of gallbladder without cholecystitis without obstruction: Secondary | ICD-10-CM | POA: Diagnosis not present

## 2019-10-19 NOTE — Progress Notes (Signed)
Patient ID: Mary Nash, female   DOB: 07-Aug-1990, 29 y.o.   MRN: [id number]  Chief Complaint  Patient presents with  . New Patient (Initial Visit)    Gallstones    HPI Mary Nash is a 29 y.o. female.   She is here as a referral from the emergency department.  She first presented there about 2 weeks ago with right upper quadrant pain.  This was associated with nausea but no vomiting.  She did not have any fevers or chills.  Right upper quadrant ultrasound was performed that demonstrated gallstones.  Her pain was able to be controlled while in the emergency department and she was discharged with plans to follow-up in my clinic.  Yesterday, she had a recurrence of her abdominal pain and came back to the emergency department.  Her labs were reassuring, and she was able to get comfortable enough to go home.  She is here today to discuss surgery for symptomatic cholelithiasis.  She has never had pancreatitis.  She denies any episodes of jaundice, acholic stools, or dark, [employer]-colored urine.  She says that her primary care provider did an ultrasound in January on her thyroid but then subsequently did an ultrasound of her abdomen and found gallstones.  The patient says that there is a "unknown mass" pushing on her left thyroid.  She reports that she has diet-controlled borderline anemia, diet-controlled borderline hypertension, and diet-controlled hypoglycemia.  She does not know if any thyroid function testing has been performed, and we are unable to access her primary care provider's records.   Past Medical History:  Diagnosis Date  . Anxiety   . Depression   . Iron deficiency     History reviewed. No pertinent surgical history.  Family History  Problem Relation Age of Onset  . Drug abuse Mother   . Heart disease Father   . Hypertension Father   . Diabetes Father     Social History Social History   Tobacco Use  . Smoking status: Never Smoker  . Smokeless tobacco: Never Used   Substance Use Topics  . Alcohol use: Never  . Drug use: Never    No Known Allergies  Current Outpatient Medications  Medication Sig Dispense Refill  . etonogestrel (NEXPLANON) 68 MG IMPL implant Inject into the skin.     No current facility-administered medications for this visit.    Review of Systems Review of Systems  Musculoskeletal: Positive for neck pain.  All other systems reviewed and are negative. or as discussed in the history of present illness.  Blood pressure 113/74, pulse 96, temperature 97.7 F (36.5 C), resp. rate 12, height 5\' 6"  (1.676 m), weight 247 lb 6.4 oz (112.2 kg), last menstrual period 10/05/2019, SpO2 98 %. Body mass index is 39.93 kg/m.  Physical Exam Physical Exam Constitutional:      General: She is not in acute distress.    Appearance: She is obese.  HENT:     Head: Normocephalic and atraumatic.     Nose:     Comments: Covered with a mask secondary to COVID-19 precautions    Mouth/Throat:     Comments: Covered with a mask secondary to COVID-19 precautions Eyes:     General: No scleral icterus.       Right eye: No discharge.        Left eye: No discharge.  Neck:     Comments: Her neck is tender, but she says this is chronic, due to an accident as a child.  I do not appreciate any thyromegaly or palpable thyroid masses.  There is no palpable cervical or supraclavicular lymphadenopathy. Cardiovascular:     Rate and Rhythm: Normal rate and regular rhythm.     Pulses: Normal pulses.     Heart sounds: No murmur.  Pulmonary:     Effort: Pulmonary effort is normal. No respiratory distress.     Breath sounds: Normal breath sounds.  Abdominal:     Palpations: Abdomen is soft.     Comments: Protuberant, consistent with her level of obesity.  She is tender to palpation in the midepigastrium and right upper quadrant.  Murphy sign is negative.  There is no rebound, guarding, or other peritoneal signs present.  Genitourinary:    Comments:  Deferred Musculoskeletal:        General: No swelling or tenderness. Normal range of motion.  Skin:    General: Skin is warm and dry.  Neurological:     General: No focal deficit present.     Mental Status: She is alert and oriented to person, place, and time.  Psychiatric:        Mood and Affect: Mood normal.        Behavior: Behavior normal.     Data Reviewed Reviewed the emergency department notes from March 24 as well as March 2019. Results for MURPHY, BUNDICK (MRN [id number]) as of 10/19/2019 11:12  Ref. Range 10/13/2019 16:58 10/13/2019 17:04 10/18/2019 13:03  Sodium Latest Ref Range: 135 - 145 mmol/L 141  138  Potassium Latest Ref Range: 3.5 - 5.1 mmol/L 4.1  4.1  Chloride Latest Ref Range: 98 - 111 mmol/L 107  106  CO2 Latest Ref Range: 22 - 32 mmol/L 28  23  Glucose Latest Ref Range: 70 - 99 mg/dL 86  98  BUN Latest Ref Range: 6 - 20 mg/dL 15  11  Creatinine Latest Ref Range: 0.44 - 1.00 mg/dL 8.08  8.11  Calcium Latest Ref Range: 8.9 - 10.3 mg/dL 9.4  8.9  Anion gap Latest Ref Range: 5 - 15  6  9   Alkaline Phosphatase Latest Ref Range: 38 - 126 U/L 49  54  Albumin Latest Ref Range: 3.5 - 5.0 g/dL 4.1  4.0  Lipase Latest Ref Range: 11 - 51 U/L 35  24  AST Latest Ref Range: 15 - 41 U/L 21  22  ALT Latest Ref Range: 0 - 44 U/L 22  22  Total Protein Latest Ref Range: 6.5 - 8.1 g/dL 7.4  7.2  Total Bilirubin Latest Ref Range: 0.3 - 1.2 mg/dL 0.4  0.6  GFR, Est Non African American Latest Ref Range: >60 mL/min >60  >60  GFR, Est African American Latest Ref Range: >60 mL/min >60  >60  WBC Latest Ref Range: 4.0 - 10.5 K/uL 7.6  6.8  RBC Latest Ref Range: 3.87 - 5.11 MIL/uL 3.95  3.93  Hemoglobin Latest Ref Range: 12.0 - 15.0 g/dL  03.1  HCT Latest Ref Range: 36.0 - 46.0 % 39.4  38.6  MCV Latest Ref Range: 80.0 - 100.0 fL 99.7  98.2  MCH Latest Ref Range: 26.0 - 34.0 pg 32.9  33.6  MCHC Latest Ref Range: 30.0 - 36.0 g/dL 59.4  58.5  RDW Latest Ref Range: 11.5 - 15.5 %  12.7  12.4  Platelets Latest Ref Range: 150 - 400 K/uL 426 (H)  436 (H)  nRBC Latest Ref Range: 0.0 - 0.2 % 0.0  0.0  Labs from each of these visits  are copied here.  Lipase is normal, as are her transaminases.  Bilirubin is also normal indicating no concern for biliary obstruction.  White blood cell count is also normal on both occasions, indicating that infection is unlikely.  The ultrasound from March 24 is copied here.  I have reviewed the scan great with the radiologist impression:CLINICAL DATA:  Right upper quadrant tenderness for 3 days  EXAM: ULTRASOUND ABDOMEN LIMITED RIGHT UPPER QUADRANT  COMPARISON:  12/18/2012  FINDINGS: Gallbladder:  Shadowing non mobile gallstone within the gallbladder neck measures up to 1.4 cm in size. There is no gallbladder wall thickening or pericholecystic fluid. Negative sonographic Murphy sign.  Common bile duct:  Diameter: 2 mm  Liver:  Increased liver echotexture consistent with fibrofatty infiltration. No focal abnormalities. Portal vein is patent on color Doppler imaging with normal direction of blood flow towards the liver.  Other: None.  IMPRESSION: 1. Nonmobile gallstone within the gallbladder neck. No evidence of acute cholecystitis. 2. Fatty infiltration of the liver.  Endings from March 29 were similar and are copied here.  Once again, I concur with the radiologist impression.  CLINICAL DATA:  Right upper quadrant pain  EXAM: ULTRASOUND ABDOMEN LIMITED RIGHT UPPER QUADRANT  COMPARISON:  Ultrasound 10/13/2019  FINDINGS: Gallbladder:  1.5 cm stone at the gallbladder neck. Normal wall thickness. Negative sonographic Murphy.  Common bile duct:  Diameter: 4 mm  Liver:  Liver appears slightly echogenic. No focal hepatic abnormality. Portal vein is patent on color Doppler imaging with normal direction of blood flow towards the liver.  Other: None.  IMPRESSION: 1. Cholelithiasis without  sonographic evidence for acute cholecystitis. 2. Liver appears slightly echogenic as may be seen with steatosis.  Assessment This is a 29 year old woman who has symptomatic cholelithiasis.  She continues to have symptoms.  I have recommended that she undergo laparoscopic cholecystectomy.  I did put an ultrasound probe on her neck today and I do not appreciate any masses in the left lobe of the thyroid or any extrinsic masses.  She has a couple of small cysts in the right lobe of her thyroid but these are of no clinical significance.  Plan I discussed the procedure in detail.  We discussed the risks and benefits of a laparoscopic cholecystectomy and possible cholangiogram including, but not limited to: bleeding, infection, injury to surrounding structures such as the intestine or liver, bile leak, retained gallstones, need to convert to an open procedure, prolonged diarrhea, blood clots such as DVT, common bile duct injury, anesthesia risks, and possible need for additional procedures. The patient had the opportunity to ask any questions and these were answered to her satisfaction.  We will work on getting her scheduled at the soonest available mutually convenient date.  Duanne Guess 10/19/2019, 11:11 AM

## 2019-10-19 NOTE — Patient Instructions (Addendum)
Our surgery scheduler will contact you to schedule your surgery. Please have the BLUE SHEET available when she calls you.   Call the office if you have any questions or concerns.  Laparoscopic Cholecystectomy Laparoscopic cholecystectomy is surgery to remove the gallbladder. The gallbladder is a pear-shaped organ that lies beneath the liver on the right side of the body. The gallbladder stores bile, which is a fluid that helps the body to digest fats. Cholecystectomy is often done for inflammation of the gallbladder (cholecystitis). This condition is usually caused by a buildup of gallstones (cholelithiasis) in the gallbladder. Gallstones can block the flow of bile, which can result in inflammation and pain. In severe cases, emergency surgery may be required. This procedure is done though small incisions in your abdomen (laparoscopic surgery). A thin scope with a camera (laparoscope) is inserted through one incision. Thin surgical instruments are inserted through the other incisions. In some cases, a laparoscopic procedure may be turned into a type of surgery that is done through a larger incision (open surgery). Tell a health care provider about:  Any allergies you have.  All medicines you are taking, including vitamins, herbs, eye drops, creams, and over-the-counter medicines.  Any problems you or family members have had with anesthetic medicines.  Any blood disorders you have.  Any surgeries you have had.  Any medical conditions you have.  Whether you are pregnant or may be pregnant. What are the risks? Generally, this is a safe procedure. However, problems may occur, including:  Infection.  Bleeding.  Allergic reactions to medicines.  Damage to other structures or organs.  A stone remaining in the common bile duct. The common bile duct carries bile from the gallbladder into the small intestine.  A bile leak from the cyst duct that is clipped when your gallbladder is  removed. What happens before the procedure? Staying hydrated Follow instructions from your health care provider about hydration, which may include:  Up to 2 hours before the procedure - you may continue to drink clear liquids, such as water, clear fruit juice, black coffee, and plain tea. Eating and drinking restrictions Follow instructions from your health care provider about eating and drinking, which may include:  8 hours before the procedure - stop eating heavy meals or foods such as meat, fried foods, or fatty foods.  6 hours before the procedure - stop eating light meals or foods, such as toast or cereal.  6 hours before the procedure - stop drinking milk or drinks that contain milk.  2 hours before the procedure - stop drinking clear liquids. Medicines  Ask your health care provider about: ? Changing or stopping your regular medicines. This is especially important if you are taking diabetes medicines or blood thinners. ? Taking medicines such as aspirin and ibuprofen. These medicines can thin your blood. Do not take these medicines before your procedure if your health care provider instructs you not to.  You may be given antibiotic medicine to help prevent infection. General instructions  Let your health care provider know if you develop a cold or an infection before surgery.  Plan to have someone take you home from the hospital or clinic.  Ask your health care provider how your surgical site will be marked or identified. What happens during the procedure?   To reduce your risk of infection: ? Your health care team will wash or sanitize their hands. ? Your skin will be washed with soap. ? Hair may be removed from the surgical area.    An IV tube may be inserted into one of your veins.  You will be given one or more of the following: ? A medicine to help you relax (sedative). ? A medicine to make you fall asleep (general anesthetic).  A breathing tube will be placed in  your mouth.  Your surgeon will make several small cuts (incisions) in your abdomen.  The laparoscope will be inserted through one of the small incisions. The camera on the laparoscope will send images to a TV screen (monitor) in the operating room. This lets your surgeon see inside your abdomen.  Air-like gas will be pumped into your abdomen. This will expand your abdomen to give the surgeon more room to perform the surgery.  Other tools that are needed for the procedure will be inserted through the other incisions. The gallbladder will be removed through one of the incisions.  Your common bile duct may be examined. If stones are found in the common bile duct, they may be removed.  After your gallbladder has been removed, the incisions will be closed with stitches (sutures), staples, or skin glue.  Your incisions may be covered with a bandage (dressing). The procedure may vary among health care providers and hospitals. What happens after the procedure?  Your blood pressure, heart rate, breathing rate, and blood oxygen level will be monitored until the medicines you were given have worn off.  You will be given medicines as needed to control your pain.  Do not drive for 24 hours if you were given a sedative. This information is not intended to replace advice given to you by your health care provider. Make sure you discuss any questions you have with your health care provider. Document Revised: 06/20/2017 Document Reviewed: 12/25/2015 [employer] Patient Education  2020 [employer]  

## 2019-10-19 NOTE — H&P (View-Only) (Signed)
Patient ID: Governor Rooks Mulvey, female   DOB: 07-Aug-1990, 29 y.o.   MRN: [id number]  Chief Complaint  Patient presents with  . New Patient (Initial Visit)    Gallstones    HPI [name] is a 29 y.o. female.   She is here as a referral from the emergency department.  She first presented there about 2 weeks ago with right upper quadrant pain.  This was associated with nausea but no vomiting.  She did not have any fevers or chills.  Right upper quadrant ultrasound was performed that demonstrated gallstones.  Her pain was able to be controlled while in the emergency department and she was discharged with plans to follow-up in my clinic.  Yesterday, she had a recurrence of her abdominal pain and came back to the emergency department.  Her labs were reassuring, and she was able to get comfortable enough to go home.  She is here today to discuss surgery for symptomatic cholelithiasis.  She has never had pancreatitis.  She denies any episodes of jaundice, acholic stools, or dark, Coca-Cola-colored urine.  She says that her primary care provider did an ultrasound in January on her thyroid but then subsequently did an ultrasound of her abdomen and found gallstones.  The patient says that there is a "unknown mass" pushing on her left thyroid.  She reports that she has diet-controlled borderline anemia, diet-controlled borderline hypertension, and diet-controlled hypoglycemia.  She does not know if any thyroid function testing has been performed, and we are unable to access her primary care provider's records.   Past Medical History:  Diagnosis Date  . Anxiety   . Depression   . Iron deficiency     History reviewed. No pertinent surgical history.  Family History  Problem Relation Age of Onset  . Drug abuse Mother   . Heart disease Father   . Hypertension Father   . Diabetes Father     Social History Social History   Tobacco Use  . Smoking status: Never Smoker  . Smokeless tobacco: Never Used   Substance Use Topics  . Alcohol use: Never  . Drug use: Never    No Known Allergies  Current Outpatient Medications  Medication Sig Dispense Refill  . etonogestrel (NEXPLANON) 68 MG IMPL implant Inject into the skin.     No current facility-administered medications for this visit.    Review of Systems Review of Systems  Musculoskeletal: Positive for neck pain.  All other systems reviewed and are negative. or as discussed in the history of present illness.  Blood pressure 113/74, pulse 96, temperature 97.7 F (36.5 C), resp. rate 12, height 5\' 6"  (1.676 m), weight 247 lb 6.4 oz (112.2 kg), last menstrual period 10/05/2019, SpO2 98 %. Body mass index is 39.93 kg/m.  Physical Exam Physical Exam Constitutional:      General: She is not in acute distress.    Appearance: She is obese.  HENT:     Head: Normocephalic and atraumatic.     Nose:     Comments: Covered with a mask secondary to COVID-19 precautions    Mouth/Throat:     Comments: Covered with a mask secondary to COVID-19 precautions Eyes:     General: No scleral icterus.       Right eye: No discharge.        Left eye: No discharge.  Neck:     Comments: Her neck is tender, but she says this is chronic, due to an accident as a child.  I do not appreciate any thyromegaly or palpable thyroid masses.  There is no palpable cervical or supraclavicular lymphadenopathy. Cardiovascular:     Rate and Rhythm: Normal rate and regular rhythm.     Pulses: Normal pulses.     Heart sounds: No murmur.  Pulmonary:     Effort: Pulmonary effort is normal. No respiratory distress.     Breath sounds: Normal breath sounds.  Abdominal:     Palpations: Abdomen is soft.     Comments: Protuberant, consistent with her level of obesity.  She is tender to palpation in the midepigastrium and right upper quadrant.  Murphy sign is negative.  There is no rebound, guarding, or other peritoneal signs present.  Genitourinary:    Comments:  Deferred Musculoskeletal:        General: No swelling or tenderness. Normal range of motion.  Skin:    General: Skin is warm and dry.  Neurological:     General: No focal deficit present.     Mental Status: She is alert and oriented to person, place, and time.  Psychiatric:        Mood and Affect: Mood normal.        Behavior: Behavior normal.     Data Reviewed Reviewed the emergency department notes from March 24 as well as March 2019. Results for Kamer, Jo-Anne N (MRN [id number]) as of 10/19/2019 11:12  Ref. Range 10/13/2019 16:58 10/13/2019 17:04 10/18/2019 13:03  Sodium Latest Ref Range: 135 - 145 mmol/L 141  138  Potassium Latest Ref Range: 3.5 - 5.1 mmol/L 4.1  4.1  Chloride Latest Ref Range: 98 - 111 mmol/L 107  106  CO2 Latest Ref Range: 22 - 32 mmol/L 28  23  Glucose Latest Ref Range: 70 - 99 mg/dL 86  98  BUN Latest Ref Range: 6 - 20 mg/dL 15  11  Creatinine Latest Ref Range: 0.44 - 1.00 mg/dL 0.63  0.66  Calcium Latest Ref Range: 8.9 - 10.3 mg/dL 9.4  8.9  Anion gap Latest Ref Range: 5 - 15  6  9  Alkaline Phosphatase Latest Ref Range: 38 - 126 U/L 49  54  Albumin Latest Ref Range: 3.5 - 5.0 g/dL 4.1  4.0  Lipase Latest Ref Range: 11 - 51 U/L 35  24  AST Latest Ref Range: 15 - 41 U/L 21  22  ALT Latest Ref Range: 0 - 44 U/L 22  22  Total Protein Latest Ref Range: 6.5 - 8.1 g/dL 7.4  7.2  Total Bilirubin Latest Ref Range: 0.3 - 1.2 mg/dL 0.4  0.6  GFR, Est Non African American Latest Ref Range: >60 mL/min >60  >60  GFR, Est African American Latest Ref Range: >60 mL/min >60  >60  WBC Latest Ref Range: 4.0 - 10.5 K/uL 7.6  6.8  RBC Latest Ref Range: 3.87 - 5.11 MIL/uL 3.95  3.93  Hemoglobin Latest Ref Range: 12.0 - 15.0 g/dL 13.0  13.2  HCT Latest Ref Range: 36.0 - 46.0 % 39.4  38.6  MCV Latest Ref Range: 80.0 - 100.0 fL 99.7  98.2  MCH Latest Ref Range: 26.0 - 34.0 pg 32.9  33.6  MCHC Latest Ref Range: 30.0 - 36.0 g/dL 33.0  34.2  RDW Latest Ref Range: 11.5 - 15.5 %  12.7  12.4  Platelets Latest Ref Range: 150 - 400 K/uL 426 (H)  436 (H)  nRBC Latest Ref Range: 0.0 - 0.2 % 0.0  0.0  Labs from each of these visits   are copied here.  Lipase is normal, as are her transaminases.  Bilirubin is also normal indicating no concern for biliary obstruction.  White blood cell count is also normal on both occasions, indicating that infection is unlikely.  The ultrasound from March 24 is copied here.  I have reviewed the scan great with the radiologist impression:CLINICAL DATA:  Right upper quadrant tenderness for 3 days  EXAM: ULTRASOUND ABDOMEN LIMITED RIGHT UPPER QUADRANT  COMPARISON:  12/18/2012  FINDINGS: Gallbladder:  Shadowing non mobile gallstone within the gallbladder neck measures up to 1.4 cm in size. There is no gallbladder wall thickening or pericholecystic fluid. Negative sonographic Murphy sign.  Common bile duct:  Diameter: 2 mm  Liver:  Increased liver echotexture consistent with fibrofatty infiltration. No focal abnormalities. Portal vein is patent on color Doppler imaging with normal direction of blood flow towards the liver.  Other: None.  IMPRESSION: 1. Nonmobile gallstone within the gallbladder neck. No evidence of acute cholecystitis. 2. Fatty infiltration of the liver.  Endings from March 29 were similar and are copied here.  Once again, I concur with the radiologist impression.  CLINICAL DATA:  Right upper quadrant pain  EXAM: ULTRASOUND ABDOMEN LIMITED RIGHT UPPER QUADRANT  COMPARISON:  Ultrasound 10/13/2019  FINDINGS: Gallbladder:  1.5 cm stone at the gallbladder neck. Normal wall thickness. Negative sonographic Murphy.  Common bile duct:  Diameter: 4 mm  Liver:  Liver appears slightly echogenic. No focal hepatic abnormality. Portal vein is patent on color Doppler imaging with normal direction of blood flow towards the liver.  Other: None.  IMPRESSION: 1. Cholelithiasis without  sonographic evidence for acute cholecystitis. 2. Liver appears slightly echogenic as may be seen with steatosis.  Assessment This is a 29 year old woman who has symptomatic cholelithiasis.  She continues to have symptoms.  I have recommended that she undergo laparoscopic cholecystectomy.  I did put an ultrasound probe on her neck today and I do not appreciate any masses in the left lobe of the thyroid or any extrinsic masses.  She has a couple of small cysts in the right lobe of her thyroid but these are of no clinical significance.  Plan I discussed the procedure in detail.  We discussed the risks and benefits of a laparoscopic cholecystectomy and possible cholangiogram including, but not limited to: bleeding, infection, injury to surrounding structures such as the intestine or liver, bile leak, retained gallstones, need to convert to an open procedure, prolonged diarrhea, blood clots such as DVT, common bile duct injury, anesthesia risks, and possible need for additional procedures. The patient had the opportunity to ask any questions and these were answered to her satisfaction.  We will work on getting her scheduled at the soonest available mutually convenient date.  Duanne Guess 10/19/2019, 11:11 AM

## 2019-10-22 ENCOUNTER — Telehealth: Payer: Self-pay | Admitting: General Surgery

## 2019-10-22 NOTE — Telephone Encounter (Signed)
Outbound call made & msg left requesting the pt call back to obtain the following info:  Surgery Date: 10/29/19 Preadmission Testing Date: 10/26/19 (phone 8a-1p) Covid Testing Date: 10/27/19 - patient advised to go to [facility] ([address] [facility]) between 8a-1p  Please remind the patient to call [phone], between 1-3:00pm the day before surgery, to find out what time to arrive for surgery.    Thank you

## 2019-10-26 ENCOUNTER — Other Ambulatory Visit: Payer: Self-pay

## 2019-10-26 ENCOUNTER — Encounter
Admission: RE | Admit: 2019-10-26 | Discharge: 2019-10-26 | Disposition: A | Discharge status: Home / Self Care | Payer: 59 | Source: Ambulatory Visit | Attending: General Surgery | Admitting: General Surgery

## 2019-10-26 DIAGNOSIS — Z01818 Encounter for other preprocedural examination: Secondary | ICD-10-CM | POA: Insufficient documentation

## 2019-10-26 HISTORY — DX: Cardiac murmur, unspecified: R01.1

## 2019-10-26 HISTORY — DX: Hypoglycemia, unspecified: E16.2

## 2019-10-26 HISTORY — DX: Dyspnea, unspecified: R06.00

## 2019-10-26 NOTE — Patient Instructions (Signed)
Your procedure is scheduled on: Friday October 29, 2019 Report to Day Surgery. To find out your arrival time please call [phone] between 1PM - 3PM on Thursday October 28, 2019.  Remember: Instructions that are not followed completely may result in serious medical risk,  up to and including death, or upon the discretion of your surgeon and anesthesiologist your  surgery may need to be rescheduled.     _X__ 1. Do not eat food after midnight the night before your procedure.                 No gum chewing or hard candies. You may drink clear liquids up to 2 hours                 before you are scheduled to arrive for your surgery- DO not drink clear                 liquids within 2 hours of the start of your surgery.                 Clear Liquids include:  water, apple juice without pulp, clear Gatorade, G2 or                  Gatorade Zero (avoid Red/Purple/Blue), Black Coffee or Tea (Do not add                 anything to coffee or tea).  __x__2.   Complete the carbohydrate drink provided to you, 2 hours before arrival.  __X__3.  On the morning of surgery brush your teeth with toothpaste and water, you                may rinse your mouth with mouthwash if you wish.  Do not swallow any toothpaste of mouthwash.     _X__ 4.  No Alcohol for 24 hours before or after surgery.   _X__ 5.  Do Not Smoke or use e-cigarettes For 24 Hours Prior to Your Surgery.                 Do not use any chewable tobacco products for at least 6 hours prior to                 surgery.  __x__ 6.  Notify your doctor if there is any change in your medical condition      (cold, fever, infections).     Do not wear jewelry, make-up, hairpins, clips or nail polish. Do not wear lotions, powders, or perfumes. You may wear deodorant. Do not shave 48 hours prior to surgery. Men may shave face and neck. Do not bring valuables to the hospital.    [facility] is not responsible for any belongings or  valuables.  Contacts, dentures or bridgework may not be worn into surgery. Leave your suitcase in the car. After surgery it may be brought to your room. For patients admitted to the hospital, discharge time is determined by your treatment team.   Patients discharged the day of surgery will not be allowed to drive home.   Make arrangements for someone to be with you for the first 24 hours of your Same Day Discharge.    Please read over the following fact sheets that you were given:    ____ Take these medicines the morning of surgery with A SIP OF WATER:    1. None     __x__ Use CHG Soap as directed  __x__  Stop Anti-inflammatories such as naproxen, Ibuprofen, Aleve, aspirin and or BC powders.   __x__ Stop supplements until after surgery.    __x__ Do not start any herbal supplements before your surgery.

## 2019-10-26 NOTE — Telephone Encounter (Signed)
Outbound call made to the patient, she states that pre-admissions has already contacted her today (10/26/19).  She states that she can't do her Covid test for 10/27/19  as she tested positive in January, 2021.  She states that she is all set to go for 10/29/19 with surgery per pre-admissions.

## 2019-10-27 ENCOUNTER — Other Ambulatory Visit: Payer: 59

## 2019-10-29 ENCOUNTER — Encounter
Admission: RE | Disposition: A | Discharge status: Home / Self Care | Payer: Self-pay | Source: Home / Self Care | Attending: General Surgery

## 2019-10-29 ENCOUNTER — Other Ambulatory Visit: Payer: Self-pay

## 2019-10-29 ENCOUNTER — Ambulatory Visit
Admission: RE | Admit: 2019-10-29 | Discharge: 2019-10-29 | Disposition: A | Discharge status: Home / Self Care | Payer: No Typology Code available for payment source | Attending: General Surgery | Admitting: General Surgery

## 2019-10-29 ENCOUNTER — Ambulatory Visit: Payer: No Typology Code available for payment source | Admitting: Registered Nurse

## 2019-10-29 ENCOUNTER — Encounter: Payer: Self-pay | Admitting: General Surgery

## 2019-10-29 DIAGNOSIS — Z6839 Body mass index (BMI) 39.0-39.9, adult: Secondary | ICD-10-CM | POA: Insufficient documentation

## 2019-10-29 DIAGNOSIS — K8020 Calculus of gallbladder without cholecystitis without obstruction: Secondary | ICD-10-CM | POA: Diagnosis not present

## 2019-10-29 DIAGNOSIS — K828 Other specified diseases of gallbladder: Secondary | ICD-10-CM | POA: Insufficient documentation

## 2019-10-29 DIAGNOSIS — F419 Anxiety disorder, unspecified: Secondary | ICD-10-CM | POA: Diagnosis not present

## 2019-10-29 DIAGNOSIS — Z8249 Family history of ischemic heart disease and other diseases of the circulatory system: Secondary | ICD-10-CM | POA: Diagnosis not present

## 2019-10-29 DIAGNOSIS — Z8489 Family history of other specified conditions: Secondary | ICD-10-CM | POA: Insufficient documentation

## 2019-10-29 DIAGNOSIS — F329 Major depressive disorder, single episode, unspecified: Secondary | ICD-10-CM | POA: Insufficient documentation

## 2019-10-29 DIAGNOSIS — K760 Fatty (change of) liver, not elsewhere classified: Secondary | ICD-10-CM | POA: Insufficient documentation

## 2019-10-29 DIAGNOSIS — Z833 Family history of diabetes mellitus: Secondary | ICD-10-CM | POA: Insufficient documentation

## 2019-10-29 DIAGNOSIS — K8010 Calculus of gallbladder with chronic cholecystitis without obstruction: Secondary | ICD-10-CM | POA: Diagnosis present

## 2019-10-29 DIAGNOSIS — D649 Anemia, unspecified: Secondary | ICD-10-CM | POA: Diagnosis not present

## 2019-10-29 HISTORY — PX: CHOLECYSTECTOMY: SHX55

## 2019-10-29 LAB — POCT PREGNANCY, URINE: Preg Test, Ur: NEGATIVE

## 2019-10-29 SURGERY — LAPAROSCOPIC CHOLECYSTECTOMY
Anesthesia: General | Site: Abdomen

## 2019-10-29 MED ORDER — DEXAMETHASONE SODIUM PHOSPHATE 10 MG/ML IJ SOLN
INTRAMUSCULAR | Status: DC | PRN
  Administered 2019-10-29: 8 mg via INTRAVENOUS

## 2019-10-29 MED ORDER — PROPOFOL 10 MG/ML IV BOLUS
INTRAVENOUS | Status: DC | PRN
  Administered 2019-10-29: 200 mg via INTRAVENOUS

## 2019-10-29 MED ORDER — DEXMEDETOMIDINE HCL IN NACL 200 MCG/50ML IV SOLN
INTRAVENOUS | Status: AC
  Filled 2019-10-29: qty 50

## 2019-10-29 MED ORDER — KETAMINE HCL 10 MG/ML IJ SOLN
INTRAMUSCULAR | Status: DC | PRN
  Administered 2019-10-29 (×2): 10 mg via INTRAVENOUS
  Administered 2019-10-29: 20 mg via INTRAVENOUS

## 2019-10-29 MED ORDER — MIDAZOLAM HCL 2 MG/2ML IJ SOLN
INTRAMUSCULAR | Status: DC | PRN
  Administered 2019-10-29: 2 mg via INTRAVENOUS

## 2019-10-29 MED ORDER — SODIUM CHLORIDE (PF) 0.9 % IJ SOLN
INTRAMUSCULAR | Status: AC
  Filled 2019-10-29: qty 10

## 2019-10-29 MED ORDER — BUPIVACAINE HCL (PF) 0.25 % IJ SOLN
INTRAMUSCULAR | Status: AC
  Filled 2019-10-29: qty 30

## 2019-10-29 MED ORDER — SUGAMMADEX SODIUM 200 MG/2ML IV SOLN
INTRAVENOUS | Status: DC | PRN
  Administered 2019-10-29: 224 mg via INTRAVENOUS

## 2019-10-29 MED ORDER — KETAMINE HCL 50 MG/ML IJ SOLN
INTRAMUSCULAR | Status: AC
  Filled 2019-10-29: qty 10

## 2019-10-29 MED ORDER — ROCURONIUM BROMIDE 10 MG/ML (PF) SYRINGE
PREFILLED_SYRINGE | INTRAVENOUS | Status: AC
  Filled 2019-10-29: qty 10

## 2019-10-29 MED ORDER — ACETAMINOPHEN 500 MG PO TABS
ORAL_TABLET | ORAL | Status: AC
  Administered 2019-10-29: 1000 mg via ORAL
  Filled 2019-10-29: qty 2

## 2019-10-29 MED ORDER — FENTANYL CITRATE (PF) 100 MCG/2ML IJ SOLN
INTRAMUSCULAR | Status: DC | PRN
  Administered 2019-10-29: 50 ug via INTRAVENOUS
  Administered 2019-10-29: 25 ug via INTRAVENOUS
  Administered 2019-10-29: 50 ug via INTRAVENOUS
  Administered 2019-10-29: 25 ug via INTRAVENOUS

## 2019-10-29 MED ORDER — CEFAZOLIN SODIUM-DEXTROSE 2-4 GM/100ML-% IV SOLN
2.0000 g | INTRAVENOUS | Status: AC
  Administered 2019-10-29: 2 g via INTRAVENOUS

## 2019-10-29 MED ORDER — IBUPROFEN 800 MG PO TABS: 800.0000 mg | ORAL_TABLET | Freq: Three times a day (TID) | ORAL | 0 refills | Status: DC | PRN

## 2019-10-29 MED ORDER — FENTANYL CITRATE (PF) 100 MCG/2ML IJ SOLN: 25.0000 ug | INTRAMUSCULAR | Status: DC | PRN

## 2019-10-29 MED ORDER — PHENYLEPHRINE HCL (PRESSORS) 10 MG/ML IV SOLN
INTRAVENOUS | Status: AC
  Filled 2019-10-29: qty 1

## 2019-10-29 MED ORDER — CEFAZOLIN SODIUM-DEXTROSE 2-4 GM/100ML-% IV SOLN
INTRAVENOUS | Status: AC
  Filled 2019-10-29: qty 100

## 2019-10-29 MED ORDER — LIDOCAINE-EPINEPHRINE 1 %-1:100000 IJ SOLN
INTRAMUSCULAR | Status: DC | PRN
  Administered 2019-10-29: 4 mL

## 2019-10-29 MED ORDER — GABAPENTIN 300 MG PO CAPS
ORAL_CAPSULE | ORAL | Status: AC
  Administered 2019-10-29: 300 mg via ORAL
  Filled 2019-10-29: qty 1

## 2019-10-29 MED ORDER — DEXAMETHASONE SODIUM PHOSPHATE 10 MG/ML IJ SOLN
INTRAMUSCULAR | Status: AC
  Filled 2019-10-29: qty 1

## 2019-10-29 MED ORDER — HYDROCODONE-ACETAMINOPHEN 5-325 MG PO TABS: 1.0000 | ORAL_TABLET | Freq: Four times a day (QID) | ORAL | 0 refills | Status: DC | PRN

## 2019-10-29 MED ORDER — PROMETHAZINE HCL 25 MG/ML IJ SOLN: 6.2500 mg | INTRAMUSCULAR | Status: DC | PRN

## 2019-10-29 MED ORDER — ACETAMINOPHEN 500 MG PO TABS: 1000.0000 mg | ORAL_TABLET | ORAL | Status: AC

## 2019-10-29 MED ORDER — LIDOCAINE HCL (PF) 2 % IJ SOLN
INTRAMUSCULAR | Status: AC
  Filled 2019-10-29: qty 10

## 2019-10-29 MED ORDER — LACTATED RINGERS IV SOLN: INTRAVENOUS | Status: DC

## 2019-10-29 MED ORDER — CHLORHEXIDINE GLUCONATE CLOTH 2 % EX PADS
6.0000 | MEDICATED_PAD | Freq: Once | CUTANEOUS | Status: AC
  Administered 2019-10-29: 6 via TOPICAL

## 2019-10-29 MED ORDER — LIDOCAINE-EPINEPHRINE 1 %-1:100000 IJ SOLN
INTRAMUSCULAR | Status: AC
  Filled 2019-10-29: qty 1

## 2019-10-29 MED ORDER — CELECOXIB 200 MG PO CAPS: 200.0000 mg | ORAL_CAPSULE | ORAL | Status: AC

## 2019-10-29 MED ORDER — FENTANYL CITRATE (PF) 100 MCG/2ML IJ SOLN
INTRAMUSCULAR | Status: AC
  Filled 2019-10-29: qty 2

## 2019-10-29 MED ORDER — PROPOFOL 10 MG/ML IV BOLUS
INTRAVENOUS | Status: AC
  Filled 2019-10-29: qty 20

## 2019-10-29 MED ORDER — FAMOTIDINE 20 MG PO TABS: 20.0000 mg | ORAL_TABLET | Freq: Once | ORAL | Status: AC

## 2019-10-29 MED ORDER — GABAPENTIN 300 MG PO CAPS: 300.0000 mg | ORAL_CAPSULE | ORAL | Status: AC

## 2019-10-29 MED ORDER — ONDANSETRON HCL 4 MG/2ML IJ SOLN
INTRAMUSCULAR | Status: AC
  Filled 2019-10-29: qty 2

## 2019-10-29 MED ORDER — DEXMEDETOMIDINE HCL 200 MCG/2ML IV SOLN
INTRAVENOUS | Status: DC | PRN
  Administered 2019-10-29: 8 ug via INTRAVENOUS
  Administered 2019-10-29: 12 ug via INTRAVENOUS
  Administered 2019-10-29: 8 ug via INTRAVENOUS
  Administered 2019-10-29: 12 ug via INTRAVENOUS
  Administered 2019-10-29: 20 ug via INTRAVENOUS

## 2019-10-29 MED ORDER — LIDOCAINE HCL (CARDIAC) PF 100 MG/5ML IV SOSY
PREFILLED_SYRINGE | INTRAVENOUS | Status: DC | PRN
  Administered 2019-10-29: 100 mg via INTRAVENOUS

## 2019-10-29 MED ORDER — CELECOXIB 200 MG PO CAPS
ORAL_CAPSULE | ORAL | Status: AC
  Administered 2019-10-29: 07:00:00 200 mg via ORAL
  Filled 2019-10-29: qty 1

## 2019-10-29 MED ORDER — ONDANSETRON HCL 4 MG/2ML IJ SOLN
INTRAMUSCULAR | Status: DC | PRN
  Administered 2019-10-29: 4 mg via INTRAVENOUS

## 2019-10-29 MED ORDER — PHENYLEPHRINE HCL (PRESSORS) 10 MG/ML IV SOLN
INTRAVENOUS | Status: DC | PRN
  Administered 2019-10-29: 200 ug via INTRAVENOUS
  Administered 2019-10-29 (×3): 100 ug via INTRAVENOUS

## 2019-10-29 MED ORDER — EPHEDRINE SULFATE 50 MG/ML IJ SOLN
INTRAMUSCULAR | Status: DC | PRN
  Administered 2019-10-29: 10 mg via INTRAVENOUS

## 2019-10-29 MED ORDER — FAMOTIDINE 20 MG PO TABS
ORAL_TABLET | ORAL | Status: AC
  Administered 2019-10-29: 07:00:00 20 mg via ORAL
  Filled 2019-10-29: qty 1

## 2019-10-29 MED ORDER — FENTANYL CITRATE (PF) 100 MCG/2ML IJ SOLN
INTRAMUSCULAR | Status: AC
  Administered 2019-10-29: 10:00:00 50 ug via INTRAVENOUS
  Filled 2019-10-29: qty 2

## 2019-10-29 MED ORDER — MIDAZOLAM HCL 2 MG/2ML IJ SOLN
INTRAMUSCULAR | Status: AC
  Filled 2019-10-29: qty 2

## 2019-10-29 MED ORDER — ROCURONIUM BROMIDE 100 MG/10ML IV SOLN
INTRAVENOUS | Status: DC | PRN
  Administered 2019-10-29: 10 mg via INTRAVENOUS
  Administered 2019-10-29: 60 mg via INTRAVENOUS

## 2019-10-29 SURGICAL SUPPLY — 49 items
APPLIER CLIP 5 13 M/L LIGAMAX5 (MISCELLANEOUS) ×2
BLADE SURG SZ11 CARB STEEL (BLADE) ×2 IMPLANT
CANISTER SUCT 1200ML W/VALVE (MISCELLANEOUS) ×2 IMPLANT
CATH CHOLANGI 4FR 420404F (CATHETERS) IMPLANT
CHLORAPREP W/TINT 26 (MISCELLANEOUS) ×2 IMPLANT
CLIP APPLIE 5 13 M/L LIGAMAX5 (MISCELLANEOUS) ×1 IMPLANT
COVER WAND RF STERILE (DRAPES) ×2 IMPLANT
DECANTER SPIKE VIAL GLASS SM (MISCELLANEOUS) ×4 IMPLANT
DEFOGGER SCOPE WARMER CLEARIFY (MISCELLANEOUS) ×2 IMPLANT
DERMABOND ADVANCED (GAUZE/BANDAGES/DRESSINGS) ×1
DERMABOND ADVANCED .7 DNX12 (GAUZE/BANDAGES/DRESSINGS) ×1 IMPLANT
ELECT CAUTERY BLADE TIP 2.5 (TIP) ×2
ELECT REM PT RETURN 9FT ADLT (ELECTROSURGICAL) ×2
ELECTRODE CAUTERY BLDE TIP 2.5 (TIP) ×1 IMPLANT
ELECTRODE REM PT RTRN 9FT ADLT (ELECTROSURGICAL) ×1 IMPLANT
GLOVE BIO SURGEON STRL SZ 6.5 (GLOVE) ×2 IMPLANT
GLOVE INDICATOR 7.0 STRL GRN (GLOVE) ×2 IMPLANT
GOWN STRL REUS W/ TWL LRG LVL3 (GOWN DISPOSABLE) ×3 IMPLANT
GOWN STRL REUS W/TWL LRG LVL3 (GOWN DISPOSABLE) ×3
GRASPER SUT TROCAR 14GX15 (MISCELLANEOUS) IMPLANT
IRRIGATION STRYKERFLOW (MISCELLANEOUS) ×1 IMPLANT
IRRIGATOR STRYKERFLOW (MISCELLANEOUS) ×2
IV CATH ANGIO 12GX3 LT BLUE (NEEDLE) IMPLANT
IV NS 1000ML (IV SOLUTION) ×1
IV NS 1000ML BAXH (IV SOLUTION) ×1 IMPLANT
KIT TURNOVER KIT A (KITS) ×2 IMPLANT
KITTNER LAPARASCOPIC 5X40 (MISCELLANEOUS) IMPLANT
LABEL OR SOLS (LABEL) ×2 IMPLANT
NEEDLE HYPO 22GX1.5 SAFETY (NEEDLE) ×2 IMPLANT
NS IRRIG 500ML POUR BTL (IV SOLUTION) ×2 IMPLANT
PACK LAP CHOLECYSTECTOMY (MISCELLANEOUS) ×2 IMPLANT
PENCIL ELECTRO HAND CTR (MISCELLANEOUS) ×2 IMPLANT
POUCH SPECIMEN RETRIEVAL 10MM (ENDOMECHANICALS) ×2 IMPLANT
SCISSORS METZENBAUM CVD 33 (INSTRUMENTS) ×2 IMPLANT
SET TUBE SMOKE EVAC HIGH FLOW (TUBING) ×2 IMPLANT
SLEEVE ADV FIXATION 5X100MM (TROCAR) ×4 IMPLANT
SOLUTION ELECTROLUBE (MISCELLANEOUS) ×2 IMPLANT
STRIP CLOSURE SKIN 1/2X4 (GAUZE/BANDAGES/DRESSINGS) ×2 IMPLANT
SUT MNCRL 4-0 (SUTURE) ×1
SUT MNCRL 4-0 27XMFL (SUTURE) ×1
SUT VIC AB 3-0 SH 27 (SUTURE) ×1
SUT VIC AB 3-0 SH 27X BRD (SUTURE) ×1 IMPLANT
SUT VICRYL 0 AB UR-6 (SUTURE) ×4 IMPLANT
SUTURE MNCRL 4-0 27XMF (SUTURE) ×1 IMPLANT
SYS KII FIOS ACCESS ABD 5X100 (TROCAR) ×2
SYSTEM KII FIOS ACES ABD 5X100 (TROCAR) ×1 IMPLANT
TROCAR ADV FIXATION 12X100MM (TROCAR) ×2 IMPLANT
TROCAR Z-THREAD OPTICAL 5X100M (TROCAR) ×2 IMPLANT
WATER STERILE IRR 1000ML POUR (IV SOLUTION) ×2 IMPLANT

## 2019-10-29 NOTE — Anesthesia Procedure Notes (Signed)
Procedure Name: Intubation Date/Time: 10/29/2019 7:36 AM Performed by: Lynden Oxford, CRNA Pre-anesthesia Checklist: Patient identified, Emergency Drugs available, Suction available and Patient being monitored Patient Re-evaluated:Patient Re-evaluated prior to induction Oxygen Delivery Method: Circle system utilized Preoxygenation: Pre-oxygenation with 100% oxygen Induction Type: IV induction Ventilation: Mask ventilation without difficulty Laryngoscope Size: McGraph and 3 Grade View: Grade I Tube type: Oral Tube size: 7.5 mm Number of attempts: 1 Airway Equipment and Method: Stylet,  Oral airway and Video-laryngoscopy Placement Confirmation: ETT inserted through vocal cords under direct vision,  positive ETCO2 and breath sounds checked- equal and bilateral Secured at: 20 cm Tube secured with: Tape Dental Injury: Teeth and Oropharynx as per pre-operative assessment

## 2019-10-29 NOTE — Op Note (Signed)
Laparoscopic Cholecystectomy  Pre-operative Diagnosis: Symptomatic cholelithiasis  Post-operative Diagnosis: Same  Procedure: Laparoscopic cholecystectomy  Surgeon: Duanne Guess, MD  Anesthesia: GETA  Assistant: None   Findings: Enlarged liver, consistent with hepatic steatosis.  The gallbladder was partially intrahepatic.  No inflammation to suggest acute cholecystitis.  Estimated Blood Loss: Less than 5 cc         Drains: None         Specimens: Gallbladder           Complications: none   Procedure Details  The patient was seen again in the preoperative holding area. The benefits, complications, treatment options, and expected outcomes were discussed with the patient. The risks of bleeding, infection, recurrence of symptoms, failure to resolve symptoms, bile duct damage, bile duct leak, retained common bile duct stone, bowel injury, any of which could require further surgery and/or ERCP, stent, or papillotomy were reviewed with the patient. The likelihood of improving the patient's symptoms with return to their baseline status is good.  The patient and/or family concurred with the proposed plan, giving informed consent.  The patient was taken to operating room, identified as Mary Nash and the procedure verified as Laparoscopic Cholecystectomy. A time out was performed and the above information confirmed.  Prior to the induction of general anesthesia, antibiotic prophylaxis was administered. VTE prophylaxis was in place. General endotracheal anesthesia was then administered and tolerated well. After the induction, the abdomen was prepped with Chloraprep and draped in the sterile fashion. The patient was positioned in the supine position.  Optiview technique was used to enter the abdominal cavity in the midclavicular line just caudal to the costal margin. Pneumoperitoneum was then created with CO2 and tolerated well without any adverse changes in the patient's vital signs.  A  10 mm periumbilical port was placed, followed by 2 additional 5-mm ports in the right upper quadrant, all under direct vision. All skin incisions were infiltrated with a local anesthetic agent before making the incision and placing the trocars.   The patient was positioned  in reverse Trendelenburg, tilted slightly to the patient's left.  The gallbladder was identified, the fundus grasped and retracted cephalad. Adhesions were lysed bluntly. The infundibulum was grasped and retracted laterally, exposing the peritoneum overlying the triangle of Calot. This was then divided and exposed in a blunt fashion. An extended critical view of the cystic duct and cystic artery was obtained.  The cystic duct was clearly identified and bluntly dissected free. Both the cystic artery and duct were double clipped and divided.  The gallbladder was taken from the gallbladder fossa in a retrograde fashion with the electrocautery. The gallbladder was removed and placed in an Endo pouch bag. The liver bed was irrigated and inspected. Hemostasis was achieved with the electrocautery. Copious saline irrigation was utilized and was repeatedly aspirated until clear.  The gallbladder and Endo pouch sac were then removed through a port site.   Inspection of the right upper quadrant was performed. No bleeding, bile duct injury or leak, or bowel injury was noted. Pneumoperitoneum was released.  The periumbilical port site was closed with interrumpted 0 Vicryl sutures. 4-0 subcuticular Monocryl was used to close the skin. Dermabond was applied, followed by Steri-Strips.  The patient was then extubated and brought to the recovery room in stable condition. Sponge, lap, and needle counts were correct at closure and at the conclusion of the case.               Duanne Guess,  MD, FACS

## 2019-10-29 NOTE — Discharge Instructions (Signed)

## 2019-10-29 NOTE — Anesthesia Preprocedure Evaluation (Signed)
Anesthesia Evaluation  Patient identified by MRN, date of birth, ID band Patient awake    Reviewed: Allergy & Precautions, H&P , NPO status , Patient's Chart, lab work & pertinent test results, reviewed documented beta blocker date and time   History of Anesthesia Complications Negative for: history of anesthetic complications  Airway Mallampati: II  TM Distance: >3 FB Neck ROM: full    Dental  (+) Dental Advidsory Given, Teeth Intact   Pulmonary neg pulmonary ROS,    Pulmonary exam normal breath sounds clear to auscultation       Cardiovascular Exercise Tolerance: Good (-) hypertension(-) angina(-) Past MI Normal cardiovascular exam(-) dysrhythmias + Valvular Problems/Murmurs  Rhythm:regular Rate:Normal     Neuro/Psych PSYCHIATRIC DISORDERS Anxiety Depression negative neurological ROS     GI/Hepatic negative GI ROS, Neg liver ROS,   Endo/Other  neg diabetesMorbid obesity  Renal/GU negative Renal ROS  negative genitourinary   Musculoskeletal   Abdominal   Peds  Hematology negative hematology ROS (+)   Anesthesia Other Findings Past Medical History: No date: Anxiety No date: Depression No date: Dyspnea     Comment:  due to pneumonia in January  No date: Heart murmur     Comment:  was diagnosed in middle school No date: Hypoglycemia No date: Iron deficiency   Reproductive/Obstetrics negative OB ROS                             Anesthesia Physical Anesthesia Plan  ASA: III  Anesthesia Plan: General   Post-op Pain Management:    Induction: Intravenous  PONV Risk Score and Plan: 3 and Ondansetron, Dexamethasone, Midazolam and Promethazine  Airway Management Planned: Oral ETT  Additional Equipment:   Intra-op Plan:   Post-operative Plan: Extubation in OR  Informed Consent: I have reviewed the patients History and Physical, chart, labs and discussed the procedure including  the risks, benefits and alternatives for the proposed anesthesia with the patient or authorized representative who has indicated his/her understanding and acceptance.     Dental Advisory Given  Plan Discussed with: Anesthesiologist, CRNA and Surgeon  Anesthesia Plan Comments:         Anesthesia Quick Evaluation

## 2019-10-29 NOTE — Interval H&P Note (Signed)
History and Physical Interval Note:  10/29/2019 7:11 AM  Mary Nash  has presented today for surgery, with the diagnosis of Symptomatic cholelithiasis.  The various methods of treatment have been discussed with the patient and family. After consideration of risks, benefits and other options for treatment, the patient has consented to  Procedure(s): LAPAROSCOPIC CHOLECYSTECTOMY (N/A) as a surgical intervention.  The patient's history has been reviewed, patient examined, no change in status, stable for surgery.  I have reviewed the patient's chart and labs.  Questions were answered to the patient's satisfaction.     Duanne Guess

## 2019-10-29 NOTE — Transfer of Care (Signed)
Immediate Anesthesia Transfer of Care Note  Patient: Mary Nash  Procedure(s) Performed: LAPAROSCOPIC CHOLECYSTECTOMY (N/A Abdomen)  Patient Location: PACU  Anesthesia Type:General  Level of Consciousness: drowsy  Airway & Oxygen Therapy: Patient Spontanous Breathing and Patient connected to face mask oxygen  Post-op Assessment: Report given to RN and Post -op Vital signs reviewed and stable  Post vital signs: Reviewed and stable  Last Vitals:  Vitals Value Taken Time  BP 128/81 10/29/19 0929  Temp 96.9 F   Pulse 77 10/29/19 0932  Resp 18 10/29/19 0932  SpO2 99 % 10/29/19 0932  Vitals shown include unvalidated device data.  Last Pain:  Vitals:   10/29/19 0613  TempSrc: Tympanic  PainSc: 6          Complications: No apparent anesthesia complications

## 2019-10-31 NOTE — Anesthesia Postprocedure Evaluation (Signed)
Anesthesia Post Note  Patient: Mary Nash  Procedure(s) Performed: LAPAROSCOPIC CHOLECYSTECTOMY (N/A Abdomen)  Patient location during evaluation: PACU Anesthesia Type: General Level of consciousness: awake and alert Pain management: pain level controlled Vital Signs Assessment: post-procedure vital signs reviewed and stable Respiratory status: spontaneous breathing, nonlabored ventilation, respiratory function stable and patient connected to nasal cannula oxygen Cardiovascular status: blood pressure returned to baseline and stable Postop Assessment: no apparent nausea or vomiting Anesthetic complications: no     Last Vitals:  Vitals:   10/29/19 1042 10/29/19 1049  BP: 124/79 127/86  Pulse: 77 80  Resp: 16 18  Temp: (!) 36 C   SpO2: 100% 100%    Last Pain:  Vitals:   10/29/19 1042  TempSrc: Temporal  PainSc: 0-No pain                 Lenard Simmer

## 2019-11-01 LAB — SURGICAL PATHOLOGY

## 2019-11-23 ENCOUNTER — Encounter: Payer: 59 | Admitting: Physician Assistant

## 2019-11-23 ENCOUNTER — Ambulatory Visit (INDEPENDENT_AMBULATORY_CARE_PROVIDER_SITE_OTHER): Payer: Self-pay | Admitting: Physician Assistant

## 2019-11-23 ENCOUNTER — Other Ambulatory Visit: Payer: Self-pay

## 2019-11-23 ENCOUNTER — Encounter: Payer: Self-pay | Admitting: Physician Assistant

## 2019-11-23 VITALS — BP 123/68 | HR 97 | Temp 98.6°F | Resp 14 | Ht 66.0 in | Wt 252.0 lb

## 2019-11-23 DIAGNOSIS — Z09 Encounter for follow-up examination after completed treatment for conditions other than malignant neoplasm: Secondary | ICD-10-CM

## 2019-11-23 DIAGNOSIS — K8020 Calculus of gallbladder without cholecystitis without obstruction: Secondary | ICD-10-CM

## 2019-11-23 NOTE — Progress Notes (Signed)
[facility] POST-OP OFFICE VISIT  11/23/2019  HPI: Mary Nash is a 29 y.o. female ~4 weeks s/p laparoscopic cholecystectomy for symptomatic cholelithiasis with Dr Lady Gary.   Overall doing well No abdominal pain, nausea, emesis, fever, chills, diarrhea She does note infrequent burping which is new, not bothersome Intermittent constipation but this is baseline, takes Miralax at home Otherwise no complaints.   Vital signs: BP 123/68   Pulse 97   Temp 98.6 F (37 C)   Resp 14   Ht 5\' 6"  (1.676 m)   Wt 252 lb (114.3 kg)   SpO2 97%   BMI 40.67 kg/m    Physical Exam: Constitutional: Well appearing female, NAD Abdomen: Soft, non-tender, non-distended, no rebound/guarding Skin: Laparoscopic incisions are CDI, no erythema or drainage   Assessment/Plan: This is a 29 y.o. female ~4 weeks s/p laparoscopic cholecystectomy for symptomatic cholelithiasis   - pain control prn  - miralax for constipation prn  - reviewed lifting restrictions; work note provided  - Surgical pathology reviewed: CCC, negative for malignancy   - rtc prn  -- 37, PA-C [facility] 11/23/2019, 1:39 PM [phone] M-F: 7am - 4pm

## 2019-11-23 NOTE — Patient Instructions (Addendum)
Return to work tomorrow with restrictions.   GENERAL POST-OPERATIVE PATIENT INSTRUCTIONS   WOUND CARE INSTRUCTIONS:  Keep a dry clean dressing on the wound if there is drainage. The initial bandage may be removed after 24 hours.  Once the wound has quit draining you may leave it open to air.  If clothing rubs against the wound or causes irritation and the wound is not draining you may cover it with a dry dressing during the daytime.  Try to keep the wound dry and avoid ointments on the wound unless directed to do so.  If the wound becomes bright red and painful or starts to drain infected material that is not clear, please contact your physician immediately.  If the wound is mildly pink and has a thick firm ridge underneath it, this is normal, and is referred to as a healing ridge.  This will resolve over the next 4-6 weeks.  BATHING: You may shower if you have been informed of this by your surgeon. However, Please do not submerge in a tub, hot tub, or pool until incisions are completely sealed or have been told by your surgeon that you may do so.  DIET:  You may eat any foods that you can tolerate.  It is a good idea to eat a high fiber diet and take in plenty of fluids to prevent constipation.  If you do become constipated you may want to take a mild laxative or take ducolax tablets on a daily basis until your bowel habits are regular.  Constipation can be very uncomfortable, along with straining, after recent surgery.  ACTIVITY:  You are encouraged to cough and deep breath or use your incentive spirometer if you were given one, every 15-30 minutes when awake.  This will help prevent respiratory complications and low grade fevers post-operatively if you had a general anesthetic.  You may want to hug a pillow when coughing and sneezing to add additional support to the surgical area, if you had abdominal or chest surgery, which will decrease pain during these times.  You are encouraged to walk and engage  in light activity for the next two weeks.  You should not lift more than 20 pounds for 4-6 weeks after surgery as it could put you at increased risk for complications.  Twenty pounds is roughly equivalent to a plastic bag of groceries. At that time- Listen to your body when lifting, if you have pain when lifting, stop and then try again in a few days. Soreness after doing exercises or activities of daily living is normal as you get back in to your normal routine.  MEDICATIONS:  Try to take narcotic medications and anti-inflammatory medications, such as tylenol, ibuprofen, naprosyn, etc., with food.  This will minimize stomach upset from the medication.  Should you develop nausea and vomiting from the pain medication, or develop a rash, please discontinue the medication and contact your physician.  You should not drive, make important decisions, or operate machinery when taking narcotic pain medication.  SUNBLOCK Use sun block to incision area over the next year if this area will be exposed to sun. This helps decrease scarring and will allow you avoid a permanent darkened area over your incision.  QUESTIONS:  Please feel free to call our office if you have any questions, and we will be glad to assist you.

## 2019-12-14 ENCOUNTER — Telehealth: Payer: Self-pay | Admitting: *Deleted

## 2019-12-14 NOTE — Telephone Encounter (Signed)
Faxed FMLA to [employer] at [phone]

## 2020-02-28 ENCOUNTER — Emergency Department: Payer: No Typology Code available for payment source

## 2020-02-28 ENCOUNTER — Other Ambulatory Visit: Payer: Self-pay

## 2020-02-28 ENCOUNTER — Emergency Department
Admission: EM | Admit: 2020-02-28 | Discharge: 2020-02-28 | Disposition: A | Discharge status: Home / Self Care | Payer: No Typology Code available for payment source | Attending: Emergency Medicine | Admitting: Emergency Medicine

## 2020-02-28 DIAGNOSIS — L568 Other specified acute skin changes due to ultraviolet radiation: Secondary | ICD-10-CM | POA: Insufficient documentation

## 2020-02-28 DIAGNOSIS — X32XXXA Exposure to sunlight, initial encounter: Secondary | ICD-10-CM | POA: Insufficient documentation

## 2020-02-28 DIAGNOSIS — R519 Headache, unspecified: Secondary | ICD-10-CM | POA: Insufficient documentation

## 2020-02-28 DIAGNOSIS — R110 Nausea: Secondary | ICD-10-CM | POA: Diagnosis not present

## 2020-02-28 MED ORDER — ONDANSETRON 4 MG PO TBDP
4.0000 mg | ORAL_TABLET | Freq: Once | ORAL | Status: AC | PRN
  Administered 2020-02-28: 4 mg via ORAL
  Filled 2020-02-28: qty 1

## 2020-02-28 MED ORDER — SODIUM CHLORIDE 0.9 % IV BOLUS
1000.0000 mL | Freq: Once | INTRAVENOUS | Status: AC
  Administered 2020-02-28: 1000 mL via INTRAVENOUS

## 2020-02-28 MED ORDER — DIPHENHYDRAMINE HCL 50 MG/ML IJ SOLN
25.0000 mg | Freq: Once | INTRAMUSCULAR | Status: AC
  Administered 2020-02-28: 25 mg via INTRAVENOUS
  Filled 2020-02-28: qty 1

## 2020-02-28 MED ORDER — METOCLOPRAMIDE HCL 5 MG/ML IJ SOLN
10.0000 mg | Freq: Once | INTRAMUSCULAR | Status: AC
  Administered 2020-02-28: 10 mg via INTRAVENOUS
  Filled 2020-02-28: qty 2

## 2020-02-28 MED ORDER — KETOROLAC TROMETHAMINE 30 MG/ML IJ SOLN
30.0000 mg | Freq: Once | INTRAMUSCULAR | Status: AC
  Administered 2020-02-28: 30 mg via INTRAVENOUS
  Filled 2020-02-28: qty 1

## 2020-02-28 NOTE — ED Provider Notes (Signed)
[facility] Emergency Department Provider Note  ____________________________________________   First MD Initiated Contact with Patient 02/28/20 [phone]     (approximate)  I have reviewed the triage vital signs and the nursing notes.   HISTORY  Chief Complaint Headache   HPI Mary Nash is a 29 y.o. female presents to the ED with complaint of nausea and headache.  Patient states that she has had a history of migraines.  Currently the headache she has now is waxing and waning for the last 3 weeks.  She has had little relief with over-the-counter medications.  Patient reports pain to the right temporal and frontal area at her hairline.  Patient has some degree of photophobia.  No fever, chills or diarrhea.  Patient denies cough.  She denies any known Covid exposure.  She rates her pain as 7 out of 10.       Past Medical History:  Diagnosis Date   Anxiety    Depression    Dyspnea    due to pneumonia in January    Heart murmur    was diagnosed in middle school   Hypoglycemia    Iron deficiency     There are no problems to display for this patient.   Past Surgical History:  Procedure Laterality Date   CHOLECYSTECTOMY N/A 10/29/2019   Procedure: LAPAROSCOPIC CHOLECYSTECTOMY;  Surgeon: Duanne Guess, MD;  Location: [facility] ORS;  Service: General;  Laterality: N/A;    Prior to Admission medications   Medication Sig Start Date End Date Taking? Authorizing Provider  etonogestrel (NEXPLANON) 68 MG IMPL implant Inject into the skin. 10/29/17   [provider]  ibuprofen (ADVIL) 800 MG tablet Take 1 tablet (800 mg total) by mouth every 8 (eight) hours as needed. 10/29/19   Duanne Guess, MD  naproxen (NAPROSYN) 250 MG tablet Take by mouth 2 (two) times daily with a meal.    [provider]    Allergies Patient has no known allergies.  Family History  Problem Relation Age of Onset   Drug abuse Mother    Heart disease Father      Hypertension Father    Diabetes Father     Social History Social History   Tobacco Use   Smoking status: Never Smoker   Smokeless tobacco: Never Used  Building services engineer Use: Never used  Substance Use Topics   Alcohol use: Never   Drug use: Never    Review of Systems Constitutional: No fever/chills Eyes: No visual changes.  Positive for photosensitivity. ENT: No sore throat. Cardiovascular: Denies chest pain. Respiratory: Denies shortness of breath. Gastrointestinal: No abdominal pain.  Positive nausea, no vomiting.  No diarrhea.   Genitourinary: Negative for dysuria. Musculoskeletal: Negative for muscle aches.  Skin: Negative for rash. Neurological: Positive for headache. No focal weakness or numbness. ____________________________________________   PHYSICAL EXAM:  VITAL SIGNS: ED Triage Vitals  Enc Vitals Group     BP 02/28/20 0045 128/84     Pulse Rate 02/28/20 0045 84     Resp 02/28/20 0045 16     Temp 02/28/20 0045 98.3 F (36.8 C)     Temp Source 02/28/20 0045 Oral     SpO2 02/28/20 0045 100 %     Weight 02/28/20 0046 240 lb (108.9 kg)     Height 02/28/20 0046 5\' 6"  (1.676 m)     Head Circumference --      Peak Flow --      Pain Score  02/28/20 0045 7     Pain Loc --      Pain Edu? --      Excl. in GC? --     Constitutional: Alert and oriented. Well appearing and in no acute distress. Eyes: Conjunctivae are normal. PERRL. EOMI. no nystagmus. Head: Atraumatic. Nose: No congestion/rhinnorhea. Mouth/Throat: Mucous membranes are moist.  Oropharynx non-erythematous. Neck: No stridor.  Range of motion is without restriction. Cardiovascular: Normal rate, regular rhythm. Grossly normal heart sounds.  Good peripheral circulation. Respiratory: Normal respiratory effort.  No retractions. Lungs CTAB. Gastrointestinal: Soft and nontender. No distention.   Musculoskeletal: Moves upper and lower extremities any difficulty.  Normal gait was  noted. Neurologic:  Normal speech and language.  Cranial nerves II through XII grossly intact.  No gross focal neurologic deficits are appreciated. No gait instability. Skin:  Skin is warm, dry and intact. No rash noted. Psychiatric: Mood and affect are normal. Speech and behavior are normal.  ____________________________________________   LABS (all labs ordered are listed, but only abnormal results are displayed)  Labs Reviewed - No data to display ____________________________________________  RADIOLOGY  Official radiology report(s): CT Head Wo Contrast  Result Date: 02/28/2020 CLINICAL DATA:  Headaches and nausea. Cerebral hemorrhage suspected. Nausea when lying down removing head. EXAM: CT HEAD WITHOUT CONTRAST TECHNIQUE: Contiguous axial images were obtained from the base of the skull through the vertex without intravenous contrast. COMPARISON:  None. FINDINGS: Brain: No acute infarct, hemorrhage, or mass lesion is present. The ventricles are of normal size. No significant extraaxial fluid collection is present. No significant white matter lesions are present. The brainstem and cerebellum are within normal limits. Vascular: No hyperdense vessel or unexpected calcification. Skull: Calvarium is intact. No focal lytic or blastic lesions are present. No significant extracranial soft tissue lesion is present. Sinuses/Orbits: The paranasal sinuses and mastoid air cells are clear. The globes and orbits are within normal limits. IMPRESSION: Negative CT of the head. Electronically Signed   By: Marin Roberts M.D.   On: 02/28/2020 06:20    ____________________________________________   PROCEDURES  Procedure(s) performed (including Critical Care):  Procedures   ____________________________________________   INITIAL IMPRESSION / ASSESSMENT AND PLAN / ED COURSE  As part of my medical decision making, I reviewed the following data within the electronic MEDICAL RECORD NUMBER Notes from prior  ED visits and [place] Controlled Substance Database  Mary Nash was evaluated in Emergency Department on 02/28/2020 for the symptoms described in the history of present illness. She was evaluated in the context of the global COVID-19 pandemic, which necessitated consideration that the patient might be at risk for infection with the SARS-CoV-2 virus that causes COVID-19. Institutional protocols and algorithms that pertain to the evaluation of patients at risk for COVID-19 are in a state of rapid change based on information released by regulatory bodies including the CDC and federal and state organizations. These policies and algorithms were followed during the patient's care in the ED.  ----------------------------------------- 10:11 AM on 02/28/2020 ----------------------------------------- reports that headache has improved and nearly resolved.  29 year old female presents to the ED with complaint of headache.  Patient reports history of migraines and that this headache has been waxing and waning for approximately 3 weeks.  She states that she has had little relief with over-the-counter medications.  Neurologically patient is intact and physical exam otherwise is normal.  Patient was given fluids along with IV Reglan, Benadryl and Toradol.  Prior to discharge patient states that she was much better and  that her headache was nearly resolved.  Patient was discharged with instructions to follow-up with her primary care provider and to continue drinking fluids to stay hydrated.  She is to return to the emergency department if any severe worsening of her symptoms.   ____________________________________________   FINAL CLINICAL IMPRESSION(S) / ED DIAGNOSES  Final diagnoses:  Acute nonintractable headache, unspecified headache type     ED Discharge Orders    None       Note:  This document was prepared using Dragon voice recognition software and may include unintentional dictation errors.     Tommi Rumps, PA-C 02/28/20 1240    Delton Prairie, MD 02/28/20 [phone]

## 2020-02-28 NOTE — Discharge Instructions (Signed)
Follow-up with your primary care provider if any continued problems.  Continue to increase fluids.  Tylenol/ibuprofen if needed for any continued headache.

## 2020-02-28 NOTE — ED Notes (Signed)
Pt reports having a HA that made her nauseous. Pt reports similar HA that lasted for 3 weeks the last part of July. Pt denies hx of migraines.

## 2020-02-28 NOTE — ED Triage Notes (Signed)
No nuchal rigidity noted. Pt denies fever, rash.

## 2020-02-28 NOTE — ED Triage Notes (Signed)
Pt states headache with nauesa. Pt states had a headache for three weeks that abated then returned today. Pt states nausea only when lying down or moving head.

## 2020-02-28 NOTE — ED Notes (Signed)
Pt in lobby in no acute distress.

## 2020-10-24 ENCOUNTER — Ambulatory Visit: Payer: Self-pay

## 2021-04-10 ENCOUNTER — Encounter: Payer: Self-pay | Admitting: General Surgery

## 2021-12-19 ENCOUNTER — Ambulatory Visit: Payer: No Typology Code available for payment source

## 2021-12-19 ENCOUNTER — Ambulatory Visit (LOCAL_COMMUNITY_HEALTH_CENTER): Payer: Medicaid Other | Admitting: Nurse Practitioner

## 2021-12-19 ENCOUNTER — Encounter: Payer: Self-pay | Admitting: Nurse Practitioner

## 2021-12-19 VITALS — BP 139/101 | Ht 66.0 in | Wt 269.0 lb

## 2021-12-19 DIAGNOSIS — Z0389 Encounter for observation for other suspected diseases and conditions ruled out: Secondary | ICD-10-CM | POA: Diagnosis not present

## 2021-12-19 DIAGNOSIS — Z01419 Encounter for gynecological examination (general) (routine) without abnormal findings: Secondary | ICD-10-CM

## 2021-12-19 DIAGNOSIS — Z113 Encounter for screening for infections with a predominantly sexual mode of transmission: Secondary | ICD-10-CM

## 2021-12-19 DIAGNOSIS — Z3046 Encounter for surveillance of implantable subdermal contraceptive: Secondary | ICD-10-CM

## 2021-12-19 DIAGNOSIS — Z3009 Encounter for other general counseling and advice on contraception: Secondary | ICD-10-CM | POA: Diagnosis not present

## 2021-12-19 DIAGNOSIS — Z1388 Encounter for screening for disorder due to exposure to contaminants: Secondary | ICD-10-CM | POA: Diagnosis not present

## 2021-12-19 LAB — WET PREP FOR TRICH, YEAST, CLUE
Trichomonas Exam: NEGATIVE
Yeast Exam: NEGATIVE

## 2021-12-19 LAB — HM HIV SCREENING LAB: HM HIV Screening: NEGATIVE

## 2021-12-19 MED ORDER — ETONOGESTREL 68 MG ~~LOC~~ IMPL
68.0000 mg | DRUG_IMPLANT | Freq: Once | SUBCUTANEOUS | Status: AC
  Administered 2021-12-19: 68 mg via SUBCUTANEOUS

## 2021-12-19 NOTE — Progress Notes (Signed)
Patient seen for, PE, PAP and std check. Nexplanon removed and reinserted. Wet prep reviewed, no tx per standing orders. PCP list given.

## 2021-12-19 NOTE — Progress Notes (Signed)
[facility] Main Number: [phone]    Family Planning Visit- Initial Visit  Subjective:  Mary Nash is a 31 y.o.  [id number]   being seen today for an initial annual visit and to discuss reproductive life planning.  The patient is currently using Hormonal Implant for pregnancy prevention. Patient reports   does not want a pregnancy in the next year.     report they are looking for a method that provides High efficacy at preventing pregnancy  Patient has the following medical conditions does not have a problem list on file.  Chief Complaint  Patient presents with   Annual Exam   Contraception    Patient reports to clinic today for a physical, STD screening, and Nexplanon removal and reinsertion.    Patient denies signs and symptoms    Body mass index is 43.42 kg/m. - Patient is eligible for diabetes screening based on BMI and age [id number]?  not applicable [id number] ordered? not applicable  Patient reports 1  partner/s in last year. Desires STI screening?  Yes  Has patient been screened once for HCV in the past?  No  No results found for: HCVAB  Does the patient have current drug use (including MJ), have a partner with drug use, and/or has been incarcerated since last result? No  If yes-- Screen for HCV through [facility]   Does the patient meet criteria for HBV testing? No  Criteria:  -Household, sexual or needle sharing contact with HBV -History of drug use -HIV positive -Those with known Hep C   Health Maintenance Due  Topic Date Due   COVID-19 Vaccine (1) Never done   HIV Screening  Never done   Hepatitis C Screening  Never done   TETANUS/TDAP  Never done   PAP SMEAR-Modifier  04/16/2019    Review of Systems  Constitutional:  Negative for chills, fever, malaise/fatigue and weight loss.  HENT:  Negative for congestion, hearing loss and sore throat.   Eyes:  Negative for blurred vision,  double vision and photophobia.  Respiratory:  Negative for shortness of breath.   Cardiovascular:  Negative for chest pain.  Gastrointestinal:  Negative for abdominal pain, blood in stool, constipation, diarrhea, heartburn, nausea and vomiting.  Genitourinary:  Negative for dysuria and frequency.  Musculoskeletal:  Negative for back pain, joint pain and neck pain.  Skin:  Negative for itching and rash.  Neurological:  Positive for headaches. Negative for dizziness and weakness.  Endo/Heme/Allergies:  Does not bruise/bleed easily.  Psychiatric/Behavioral:  Negative for depression, substance abuse and suicidal ideas.    The following portions of the patient's history were reviewed and updated as appropriate: allergies, current medications, past family history, past medical history, past social history, past surgical history and problem list. Problem list updated.   See flowsheet for other program required questions.  Objective:   Vitals:   12/19/21 0846  BP: (!) 139/101  Weight: 269 lb (122 kg)  Height: 5\' 6"  (1.676 m)    Physical Exam Constitutional:      Appearance: Normal appearance. She is obese.  HENT:     Head: Normocephalic.     Right Ear: External ear normal.     Left Ear: External ear normal.     Nose: Nose normal.     Mouth/Throat:     Lips: Pink.     Mouth: Mucous membranes are moist.     Comments: No dental caries  Eyes:     Pupils: Pupils are equal, round, and reactive to light.  Cardiovascular:     Rate and Rhythm: Normal rate and regular rhythm.  Pulmonary:     Effort: Pulmonary effort is normal.     Breath sounds: Normal breath sounds.  Chest:     Comments: Breasts:        Right: Normal. No swelling, mass, nipple discharge, skin change or tenderness.        Left: Normal. No swelling, mass, nipple discharge, skin change or tenderness.   Abdominal:     General: Abdomen is flat. Bowel sounds are normal.     Palpations: Abdomen is soft.  Genitourinary:     Comments: External genitalia/pubic area without nits, lice, edema, erythema, lesions and inguinal adenopathy. Vagina with normal mucosa and discharge. Cervix without visible lesions. Uterus firm, mobile, nt, no masses, no CMT, no adnexal tenderness or fullness. pH 4.5. Musculoskeletal:     Cervical back: Full passive range of motion without pain, normal range of motion and neck supple.  Skin:    General: Skin is warm and dry.  Neurological:     Mental Status: She is alert and oriented to person, place, and time.  Psychiatric:        Attention and Perception: Attention normal.        Mood and Affect: Mood normal.        Speech: Speech normal.        Behavior: Behavior normal. Behavior is cooperative.      Assessment and Plan:  Mary Nash is a 31 y.o. female presenting to the [facility] Department for an initial annual wellness/contraceptive visit  Contraception counseling: Reviewed options based on patient desire and reproductive life plan. Patient is interested in Hormonal Implant. This was provided to the patient today.   Risks, benefits, and typical effectiveness rates were reviewed.  Questions were answered.  Written information was also given to the patient to review.    The patient will follow up in  1 years for surveillance.  The patient was told to call with any further questions, or with any concerns about this method of contraception.  Emphasized use of condoms 100% of the time for STI prevention.  Need for ECP was assessed. Patient not given ECP due to continuous use of birth control.   1. Family planning counseling -31 year old female in clinic today for a physical, STD screening, and a Nexplanon removal and reinsertion.   -ROS reviewed.  Patient complains of frequent headaches.  Patient states headaches are relieved with Tylenol and rest.  Also encouraged patient to eat at least 6 small meals a day and drink at least 6-8 bottles of water.   -Patient  interested in Nexplanon removal and reinsertion.  Please see removal and reinsertion note.  [id number].  Patient denies signs of depression.  Has history of anxiety and depression.  Encouraged to follow up with counselor.    2. Well woman exam with routine gynecological exam -Normal well woman exam.  -CBE today, next due 11/2024 -PAP performed today.   - IGP, Aptima HPV  3. Screening examination for venereal disease -STD screening today. -Patient accepted all screenings including oral GC, vaginal CT/GC and bloodwork for HIV/RPR.  Patient meets criteria for HepB screening? No. Ordered? No - low risk  Patient meets criteria for HepC screening? No. Ordered? No - low risk   Treat wet prep per standing order Discussed time line for State Lab results  and that patient will be called with positive results and encouraged patient to call if she had not heard in 2 weeks.  Counseled to return or seek care for continued or worsening symptoms Recommended condom use with all sex  Patient is currently using *Nexplanon to prevent pregnancy.    - WET PREP FOR [place], YEAST, CLUE - HIV [place] [facility] - Syphilis Serology, [place] [facility] - Chlamydia/Gonorrhea [place] [facility] - Gonococcus culture  4. Encounter for removal and reinsertion of Nexplanon -See Nexplanon removal and reinsertion note.  - etonogestrel (NEXPLANON) implant 68 mg   Return in about 1 year (around 12/20/2022) for Annual well-woman exam.    Gregary Cromer, FNP

## 2021-12-19 NOTE — Progress Notes (Signed)
Nexplanon Removal and Insertion  Patient identified, informed consent performed, consent signed.   Patient does understand that irregular bleeding is a very common side effect of this medication. She was advised to have backup contraception for one week after replacement of the implant. Patient deemed to meet WHO criteria for being reasonably certain she is not pregnant.  Appropriate time out taken. Nexplanon site identified. Area prepped in usual sterile fashon. 3 ml of 1% lidocaine with epinephrine was used to anesthetize the area at the distal end of the implant. A small stab incision was made right beside the implant on the distal portion. The Nexplanon rod was grasped using hemostats and removed without difficulty. There was minimal blood loss. There were no complications.   Confirmed correct location of insertion site. The insertion site was identified 8-10 cm (3-4 inches) from the medial epicondyle of the humerus and 3-5 cm (1.25-2 inches) posterior to (below) the sulcus (groove) between the biceps and triceps muscles of the patient's left arm. New Nexplanon removed from packaging, Device confirmed in needle, then inserted full length of needle and withdrawn per handbook instructions. Nexplanon was able to palpated in the patient's left arm; patient palpated the insert herself.  There was minimal blood loss. Patient insertion site covered with guaze and a pressure bandage to reduce any bruising. The patient tolerated the procedure well and was given post procedure instructions.   Tiwana Chavis, FNP  

## 2021-12-23 LAB — GONOCOCCUS CULTURE

## 2021-12-26 LAB — IGP, APTIMA HPV
HPV Aptima: NEGATIVE
PAP Smear Comment: 0

## 2021-12-28 ENCOUNTER — Telehealth: Payer: Self-pay

## 2021-12-28 NOTE — Telephone Encounter (Signed)
Calling pt regarding positive chlamydia result from 12/19/21 vaginal specimen.  Pt needs tx appt.

## 2021-12-31 NOTE — Telephone Encounter (Signed)
Received call back from pt and pt confirmed password. Counseled pt regarding + CT result.  Pt states she has transportation issues.  She will need to make a few phone calls and determine how she will get to [facility].  She does not have insurance. States she will call [facility] for tx appt once she know status of getting transportation arranged.

## 2021-12-31 NOTE — Telephone Encounter (Signed)
Phone call to pt at [phone]. Mailbox full. Unable to leave message. Tried twice.  Sent MyChart message.

## 2022-01-02 NOTE — Telephone Encounter (Signed)
Phone call to pt for follow-up.  Mailbox full, unable to leave message.

## 2022-01-04 NOTE — Telephone Encounter (Signed)
Phone call to pt for follow-up.  Mailbox full, unable to leave message. Tried twice.

## 2022-01-09 NOTE — Telephone Encounter (Signed)
Phone call to pt. Pt states she can come next week for tx appt.  States her partner refuses to be tested, believes this pt's test was a false positive.   Pt requests retest for chlamydia prior to tx; might take tx anyway but wants retest. Encouraged pt to have partner call for appt and tx. May want expedited partner therapy.  Appt scheduled for 01/16/22.

## 2022-01-16 ENCOUNTER — Encounter: Payer: Self-pay | Admitting: Nurse Practitioner

## 2022-01-16 ENCOUNTER — Ambulatory Visit: Payer: Self-pay | Admitting: Nurse Practitioner

## 2022-01-16 DIAGNOSIS — Z3009 Encounter for other general counseling and advice on contraception: Secondary | ICD-10-CM | POA: Diagnosis not present

## 2022-01-16 DIAGNOSIS — F419 Anxiety disorder, unspecified: Secondary | ICD-10-CM | POA: Insufficient documentation

## 2022-01-16 DIAGNOSIS — Z0389 Encounter for observation for other suspected diseases and conditions ruled out: Secondary | ICD-10-CM | POA: Diagnosis not present

## 2022-01-16 DIAGNOSIS — F32A Depression, unspecified: Secondary | ICD-10-CM | POA: Insufficient documentation

## 2022-01-16 DIAGNOSIS — Z113 Encounter for screening for infections with a predominantly sexual mode of transmission: Secondary | ICD-10-CM

## 2022-01-16 DIAGNOSIS — R011 Cardiac murmur, unspecified: Secondary | ICD-10-CM | POA: Insufficient documentation

## 2022-01-16 DIAGNOSIS — E611 Iron deficiency: Secondary | ICD-10-CM | POA: Insufficient documentation

## 2022-01-16 DIAGNOSIS — Z1388 Encounter for screening for disorder due to exposure to contaminants: Secondary | ICD-10-CM | POA: Diagnosis not present

## 2022-01-16 DIAGNOSIS — A749 Chlamydial infection, unspecified: Secondary | ICD-10-CM

## 2022-01-16 DIAGNOSIS — E162 Hypoglycemia, unspecified: Secondary | ICD-10-CM | POA: Insufficient documentation

## 2022-01-16 MED ORDER — DOXYCYCLINE HYCLATE 100 MG PO TABS: 100.0000 mg | ORAL_TABLET | Freq: Two times a day (BID) | ORAL | 0 refills | Status: DC

## 2022-01-16 NOTE — Progress Notes (Unsigned)
Pt here for STD screening.  Wet prep results reviewed, no treatment required per Provider.  Condoms declined.  Berdie Ogren, RN

## 2022-01-16 NOTE — Progress Notes (Signed)
Currently out of the relationship.  Denies physical and sexual abuse.

## 2022-01-17 LAB — WET PREP FOR TRICH, YEAST, CLUE: Trichomonas Exam: NEGATIVE

## 2022-01-17 NOTE — Progress Notes (Signed)
[facility] Department  STI clinic/screening visit [address] [place] [address] [phone]  Subjective:  Mary Nash is a 31 y.o. female being seen today for an STI screening visit. The patient reports they do not have symptoms.  Patient reports that they do not desire a pregnancy in the next year.   They reported they are interested in discussing contraception today.  Currently has the Implant in place.    No LMP recorded. Patient has had an implant.   Patient has the following medical conditions:   Patient Active Problem List   Diagnosis Date Noted   Heart murmur 01/16/2022   Hypoglycemia 01/16/2022   Iron deficiency 01/16/2022   Depression 01/16/2022   Anxiety 01/16/2022    Chief Complaint  Patient presents with   SEXUALLY TRANSMITTED DISEASE    HPI  Patient reports to clinic today for an STD screening.  Patient reports that she received a phone call stating that she was positive for Chlamydia.  Patient would like to be rescreened because she and her partner feel like the results may be false due to them not having symptoms.  Explained to patient that you can be positive for an STD and be asymptomatic.    Last HIV test per patient/review of record was 12/19/21. Patient reports last pap was 12/19/21.   Screening for MPX risk: Does the patient have an unexplained rash? No Is the patient MSM? No Does the patient endorse multiple sex partners or anonymous sex partners? No Did the patient have close or sexual contact with a person diagnosed with MPX? No Has the patient traveled outside the [place] where MPX is endemic? No Is there a high clinical suspicion for MPX-- evidenced by one of the following No  -Unlikely to be chickenpox  -Lymphadenopathy  -Rash that present in same phase of evolution on any given body part See flowsheet for further details and programmatic requirements.   Immunization history:   There is no immunization history on  file for this patient.   The following portions of the patient's history were reviewed and updated as appropriate: allergies, current medications, past medical history, past social history, past surgical history and problem list.  Objective:  There were no vitals filed for this visit.  Physical Exam Constitutional:      Appearance: Normal appearance.  HENT:     Head: Normocephalic. No abrasion, masses or laceration. Hair is normal.     Mouth/Throat:     Mouth: No oral lesions.     Pharynx: No oropharyngeal exudate or posterior oropharyngeal erythema.     Tonsils: No tonsillar exudate or tonsillar abscesses.     Comments: Poor dentition  Eyes:     General: Lids are normal.        Right eye: No discharge.        Left eye: No discharge.     Conjunctiva/sclera: Conjunctivae normal.     Right eye: No exudate.    Left eye: No exudate. Abdominal:     General: Abdomen is flat.     Palpations: Abdomen is soft.     Tenderness: There is no abdominal tenderness. There is no rebound.  Genitourinary:    Pubic Area: No rash or pubic lice.      Labia:        Right: No rash, tenderness, lesion or injury.        Left: No rash, tenderness, lesion or injury.      Vagina: Normal. No  vaginal discharge, erythema or lesions.     Cervix: No cervical motion tenderness, discharge, lesion or erythema.     Uterus: Not enlarged and not tender.      Rectum: Normal.     Comments: Amount Discharge: small  Odor: Yes pH: less than 4.5 Adheres to vaginal wall: No Color: color of discharge matches the Olar Santini swab Musculoskeletal:     Cervical back: Full passive range of motion without pain, normal range of motion and neck supple.  Lymphadenopathy:     Cervical: No cervical adenopathy.     Right cervical: No superficial, deep or posterior cervical adenopathy.    Left cervical: No superficial, deep or posterior cervical adenopathy.     Upper Body:     Right upper body: No supraclavicular, axillary or  epitrochlear adenopathy.     Left upper body: No supraclavicular, axillary or epitrochlear adenopathy.     Lower Body: No right inguinal adenopathy. No left inguinal adenopathy.  Skin:    General: Skin is warm and dry.     Findings: No lesion or rash.  Neurological:     Mental Status: She is alert and oriented to person, place, and time.  Psychiatric:        Attention and Perception: Attention normal.        Mood and Affect: Mood normal.        Speech: Speech normal.        Behavior: Behavior normal. Behavior is cooperative.      Assessment and Plan:  Mary Nash is a 31 y.o. female presenting to the [facility] Department for STI screening  1. Screening examination for venereal disease -31 year old female in clinic for an STD screening. -Patient accepted all screenings including oral GC,  vaginal CT/GC, wet prep, and declines bloodwork for HIV/RPR.  Patient meets criteria for HepB screening? No. Ordered? No - low risk  Patient meets criteria for HepC screening?No . Ordered? No - low risk   Treat wet prep per standing order Discussed time line for [facility] results and that patient will be called with positive results and encouraged patient to call if she had not heard in 2 weeks.  Counseled to return or seek care for continued or worsening symptoms Recommended condom use with all sex  Patient is currently using *Nexplanon to prevent pregnancy.    - WET PREP FOR TRICH, YEAST, CLUE - Gonococcus culture - Chlamydia/Gonorrhea [facility]  2. Chlamydia infection -Please treat patient for Chlamydia from 12/19/21.  - doxycycline (VIBRA-TABS) 100 MG tablet; Take 1 tablet (100 mg total) by mouth 2 (two) times daily.  Dispense: 14 tablet; Refill: 0     Return if symptoms worsen or fail to improve.    Glenna Fellows, FNP

## 2022-01-17 NOTE — Telephone Encounter (Signed)
Pt kept 01/16/22 appt, included tx.

## 2022-01-21 LAB — GONOCOCCUS CULTURE

## 2022-01-24 ENCOUNTER — Telehealth: Payer: Self-pay

## 2022-01-24 NOTE — Telephone Encounter (Signed)
Calling pt regarding positive chlamydia result from 01/16/22 vaginal specimen.  Inform pt of result, tx dispensed during provider visit on 01/16/22, RTC for TOC in approx 3 months.   Phone call to pt and pt provided password. Counseled re + CT result. Pt not having problems with medication. RTC in ~ 3 months for TOC. Partner needs proper tx before sexual contact with partner. Pt expressed understanding.

## 2022-04-14 IMAGING — CT CT HEAD W/O CM
3 series · 15 of 44 positions shown, 18 images · non-contrast
Comparison: None.

CLINICAL DATA: Headaches and nausea. Cerebral hemorrhage suspected.
Nausea when lying down removing head.

EXAM:
CT HEAD WITHOUT CONTRAST
TECHNIQUE: Contiguous axial images were obtained from the base of the skull
through the vertex without intravenous contrast.

[Series 3: head wo · axial · 0.41mm/px · z∈[-65,+45]mm · 9 of 27 slices shown, 12 images]
[im 3/27  brain]
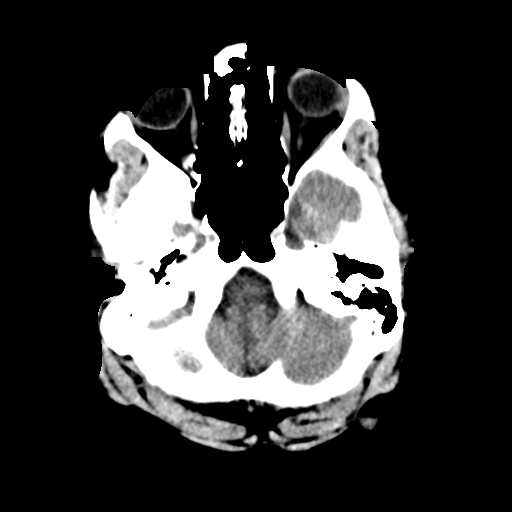
[im 3/27  bone]
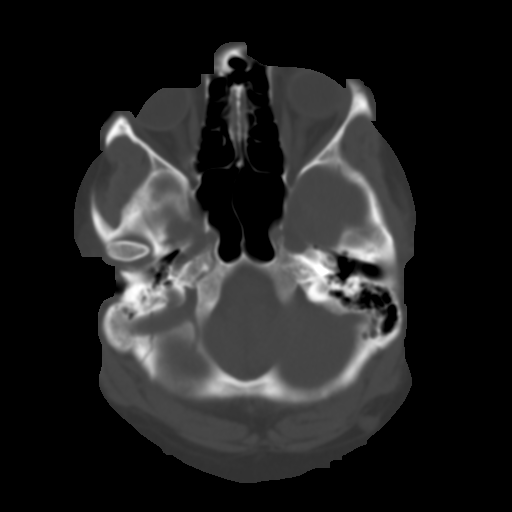
[im 6/27  brain]
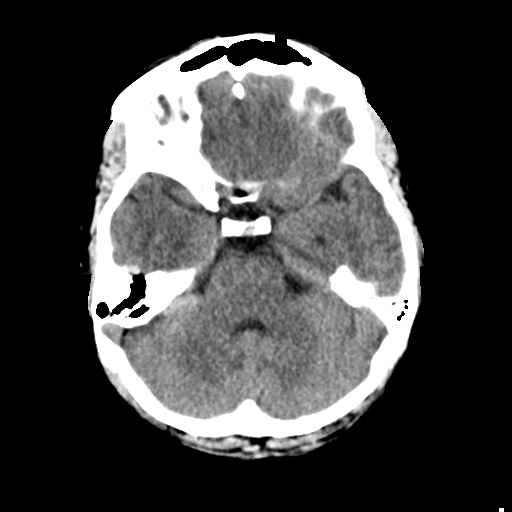
[im 8/27  brain]
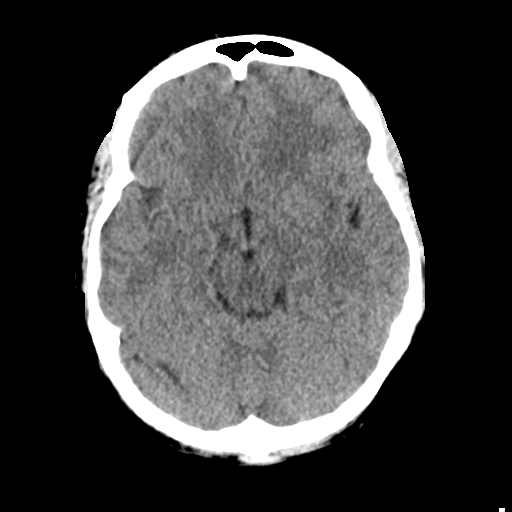
[im 11/27  brain]
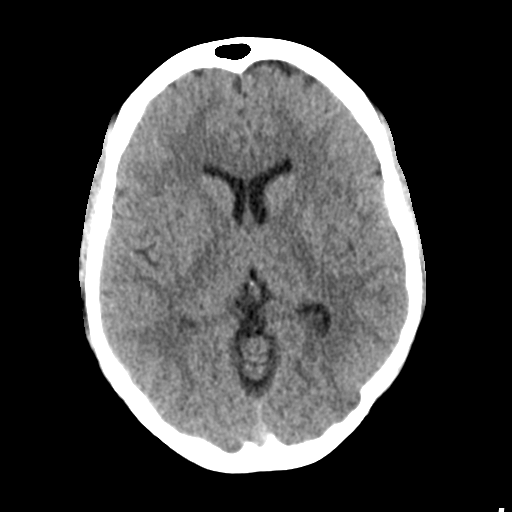
[im 14/27  brain]
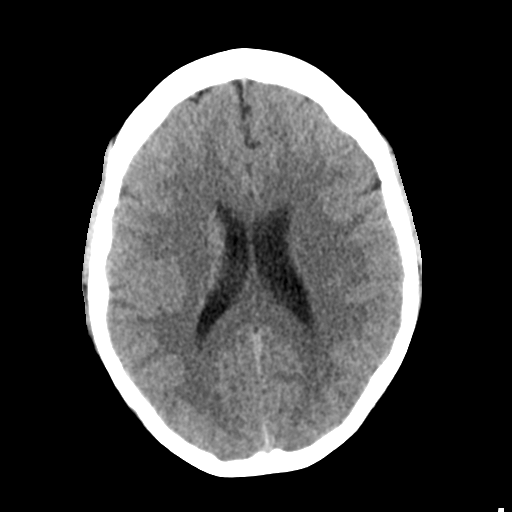
[im 14/27  bone]
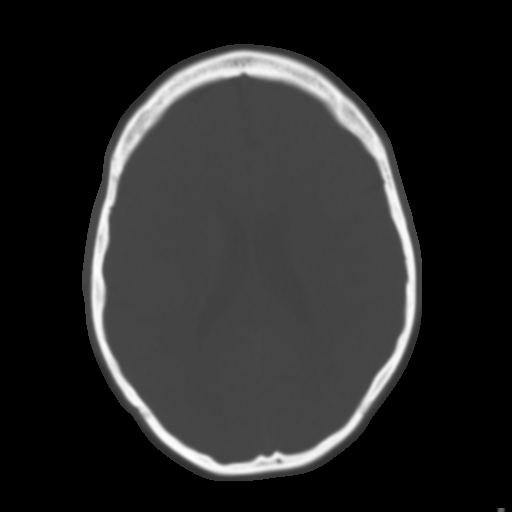
[im 17/27  brain]
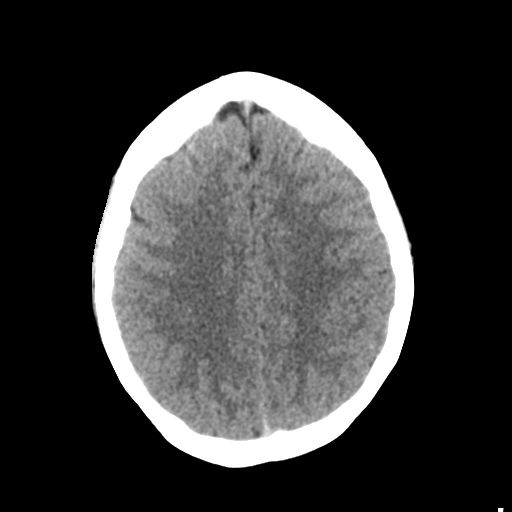
[im 20/27  brain]
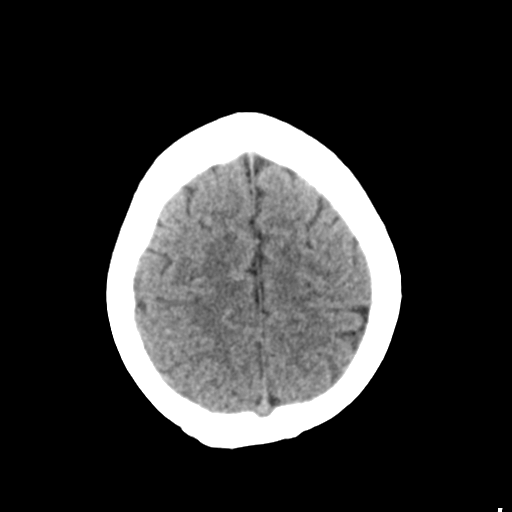
[im 22/27  brain]
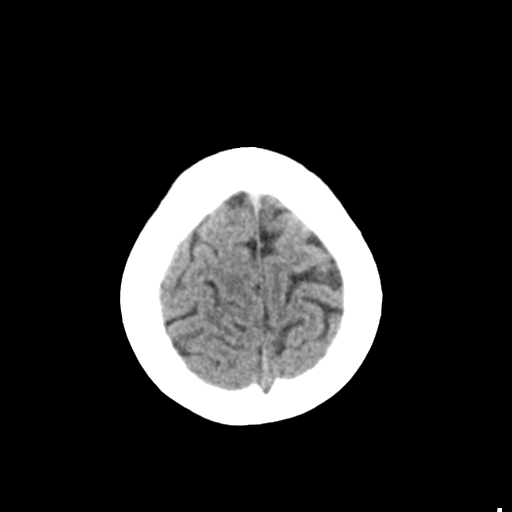
[im 25/27  brain]
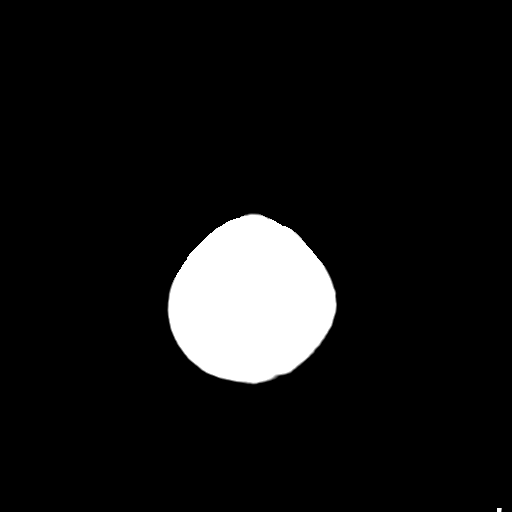
[im 25/27  bone]
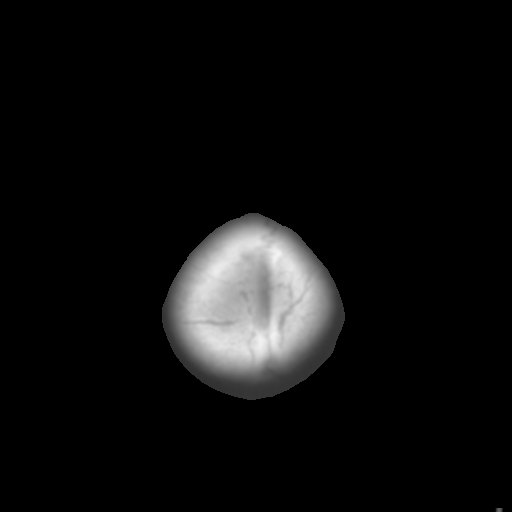

[Series 4: coronal soft tissue · coronal · 0.27mm/px · 3 of 60 slices shown]
[im 20/60  brain]
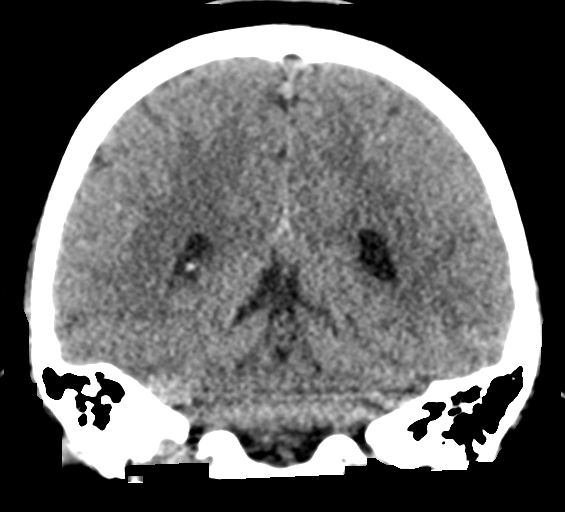
[im 27/60  brain]
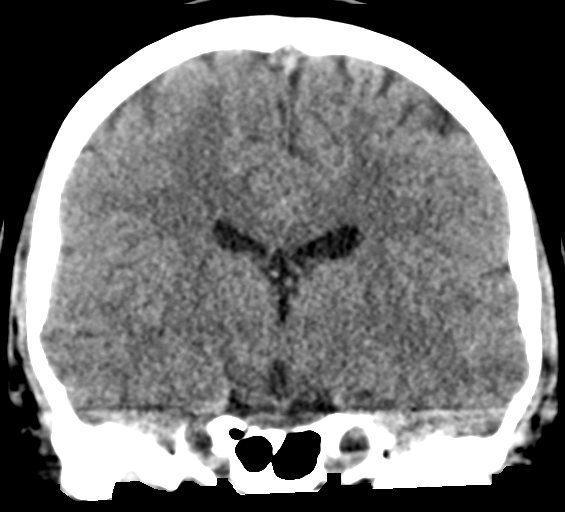
[im 33/60  brain]
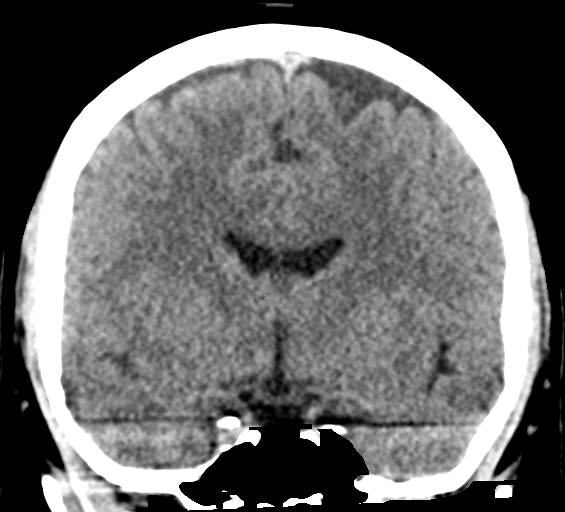

[Series 5: sagittal soft tissue · sagittal · 0.27mm/px · 3 of 51 slices shown]
[im 17/51  brain]
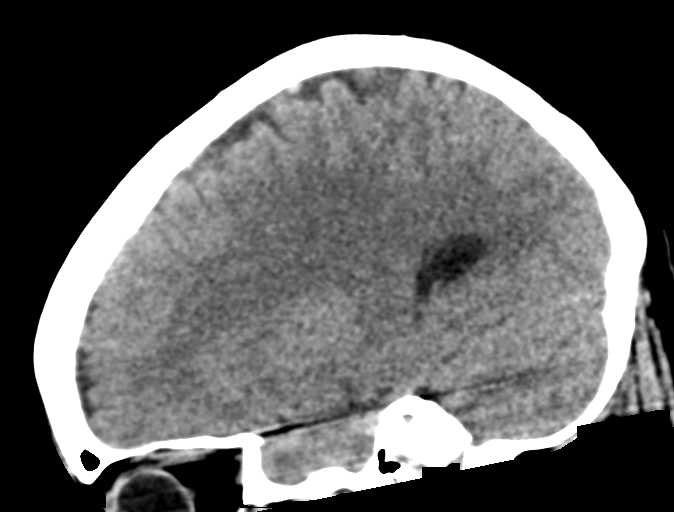
[im 26/51  brain]
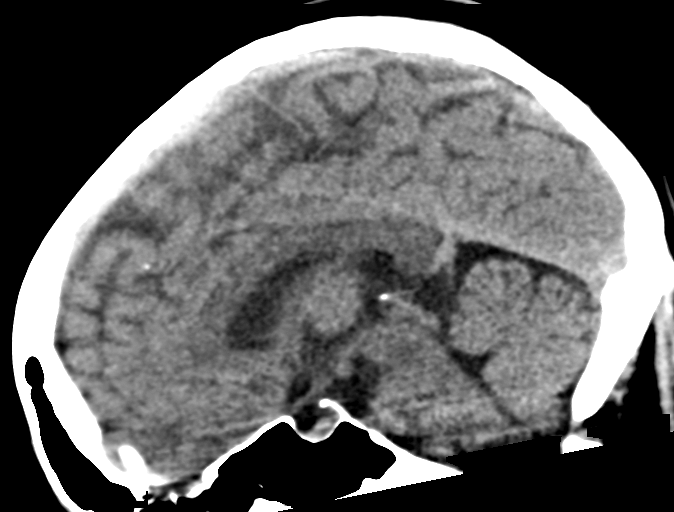
[im 34/51  brain]
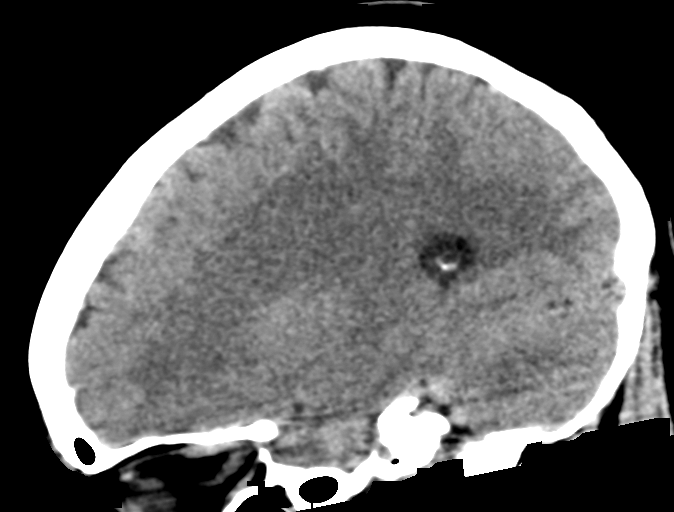

[15 of 44 positions shown; findings below may reference images not displayed]

FINDINGS: Brain: No acute infarct, hemorrhage, or mass lesion is present. The
ventricles are of normal size. No significant extraaxial fluid
collection is present. No significant white matter lesions are
present. The brainstem and cerebellum are within normal limits.

Vascular: No hyperdense vessel or unexpected calcification.

Skull: Calvarium is intact. No focal lytic or blastic lesions are
present. No significant extracranial soft tissue lesion is present.

Sinuses/Orbits: The paranasal sinuses and mastoid air cells are
clear. The globes and orbits are within normal limits.
IMPRESSION: Negative CT of the head.

## 2022-06-21 DIAGNOSIS — Z419 Encounter for procedure for purposes other than remedying health state, unspecified: Secondary | ICD-10-CM | POA: Diagnosis not present

## 2022-07-22 DIAGNOSIS — Z419 Encounter for procedure for purposes other than remedying health state, unspecified: Secondary | ICD-10-CM | POA: Diagnosis not present

## 2022-08-01 ENCOUNTER — Emergency Department
Admission: EM | Admit: 2022-08-01 | Discharge: 2022-08-01 | Disposition: A | Discharge status: Home / Self Care | Payer: Medicaid Other | Attending: Emergency Medicine | Admitting: Emergency Medicine

## 2022-08-01 ENCOUNTER — Other Ambulatory Visit: Payer: Self-pay

## 2022-08-01 DIAGNOSIS — U071 COVID-19: Secondary | ICD-10-CM | POA: Diagnosis not present

## 2022-08-01 DIAGNOSIS — R000 Tachycardia, unspecified: Secondary | ICD-10-CM | POA: Diagnosis not present

## 2022-08-01 DIAGNOSIS — R531 Weakness: Secondary | ICD-10-CM | POA: Diagnosis present

## 2022-08-01 LAB — RESP PANEL BY RT-PCR (RSV, FLU A&B, COVID)  RVPGX2
Influenza A by PCR: NEGATIVE
Influenza B by PCR: NEGATIVE
Resp Syncytial Virus by PCR: NEGATIVE
SARS Coronavirus 2 by RT PCR: POSITIVE — AB

## 2022-08-01 MED ORDER — ALBUTEROL SULFATE HFA 108 (90 BASE) MCG/ACT IN AERS: 2.0000 | INHALATION_SPRAY | Freq: Four times a day (QID) | RESPIRATORY_TRACT | 2 refills | Status: AC | PRN

## 2022-08-01 MED ORDER — NIRMATRELVIR/RITONAVIR (PAXLOVID)TABLET: 3.0000 | ORAL_TABLET | Freq: Two times a day (BID) | ORAL | 0 refills | Status: AC

## 2022-08-01 MED ORDER — BENZONATATE 100 MG PO CAPS: 100.0000 mg | ORAL_CAPSULE | Freq: Three times a day (TID) | ORAL | 0 refills | Status: AC | PRN

## 2022-08-01 MED ORDER — ACETAMINOPHEN 500 MG PO TABS: 1000.0000 mg | ORAL_TABLET | Freq: Four times a day (QID) | ORAL | 2 refills | Status: AC | PRN

## 2022-08-01 NOTE — ED Provider Notes (Signed)
[facility] Provider Note    Event Date/Time   First MD Initiated Contact with Patient 08/01/22 [phone]     (approximate)   History   Chief Complaint Weakness   HPI Mary Nash is a 32 y.o. female, history of anxiety, depression, iron deficiency, presents to the emergency department for evaluation of weakness and cough x 1 day.  She states that she was possibly exposed to [place] recently, though her at home test was negative.  She states that she has had COVID-19 in the past and this feels very similar.  Endorses cough, body aches, and weakness.  Denies nausea/vomiting, diarrhea, significant shortness of breath, rash/lesions, dizziness/lightheadedness, vision change, hearing changes, abdominal pain, or flank pain.  History Limitations: No limitations.        Physical Exam  Triage Vital Signs: ED Triage Vitals  Enc Vitals Group     BP 08/01/22 0828 123/83     Pulse Rate 08/01/22 0828 (!) 108     Resp 08/01/22 0828 18     Temp 08/01/22 0828 98.5 F (36.9 C)     Temp Source 08/01/22 0828 Oral     SpO2 08/01/22 0828 97 %     Weight --      Height --      Head Circumference --      Peak Flow --      Pain Score 08/01/22 0833 4     Pain Loc --      Pain Edu? --      Excl. in [place]? --     Most recent vital signs: Vitals:   08/01/22 0900 08/01/22 0925  BP: 115/77   Pulse: (!) 101 95  Resp:    Temp:    SpO2: 99% 99%    General: Awake, NAD.  Skin: Warm, dry. No rashes or lesions.  Eyes: PERRL. Conjunctivae normal.  CV: Good peripheral perfusion.  Resp: Normal effort.  Lung sounds are clear bilaterally. Abd: Soft, non-tender. No distention.  Neuro: At baseline. No gross neurological deficits.  Musculoskeletal: Normal ROM of all extremities.   Physical Exam    ED Results / Procedures / Treatments  Labs (all labs ordered are listed, but only abnormal results are displayed) Labs Reviewed  RESP PANEL BY RT-PCR (RSV, FLU A&B, COVID)   [id number] - Abnormal; Notable for the following components:      Result Value   SARS Coronavirus 2 by RT PCR POSITIVE (*)    All other components within normal limits     EKG Sinus tachycardia, rate of 111, no segment changes, normal QRS, no QT prolongation.    RADIOLOGY  ED Provider Interpretation: N/A.  No results found.  PROCEDURES:  Critical Care performed: N/A.  Procedures    MEDICATIONS ORDERED IN ED: Medications - No data to display   IMPRESSION / MDM / [place] / ED COURSE  I reviewed the triage vital signs and the nursing notes.                              Differential diagnosis includes, but is not limited to, COVID-19, influenza, RSV, community-acquired pneumonia, bronchitis.  Assessment/Plan Presentation consistent with COVID-19, confirmed by PCR.  EKG is unremarkable.  She appears well clinically.  Lung sounds are clear bilaterally in the apices and bases.  Very low suspicion for pneumonia.  No indication for chest x-ray at this time.  Discussed the risks and  benefits of Paxlovid with her.  She does not have any significant comorbidities, but given her prior history of complications from [id number], she has opted to try the Paxlovid treatment.  I believe this is reasonable.  Will provide her with other medications to help manage her symptoms as well.  Recommend follow-up with primary care provider as needed.  Will discharge.  Provided the patient with anticipatory guidance, return precautions, and educational material. Encouraged the patient to return to the emergency department at any time if they begin to experience any new or worsening symptoms. Patient expressed understanding and agreed with the plan.   Patient's presentation is most consistent with acute complicated illness / injury requiring diagnostic workup.       FINAL CLINICAL IMPRESSION(S) / ED DIAGNOSES   Final diagnoses:  COVID-19     Rx / DC Orders   ED Discharge Orders           Ordered    nirmatrelvir/ritonavir (PAXLOVID) 20 x 150 MG & 10 x 100MG  TABS  2 times daily        08/01/22 0932    albuterol (VENTOLIN HFA) 108 (90 Base) MCG/ACT inhaler  Every 6 hours PRN        08/01/22 0932    acetaminophen (TYLENOL) 500 MG tablet  Every 6 hours PRN        08/01/22 0932    benzonatate (TESSALON PERLES) 100 MG capsule  3 times daily PRN        08/01/22 0932             Note:  This document was prepared using Dragon voice recognition software and may include unintentional dictation errors.   Teodoro Spray, [place] 08/01/22 5038    Blake Divine, MD 08/01/22 [id number]

## 2022-08-01 NOTE — ED Notes (Signed)
Pt c/o fatigue, weakness, body aches, fever of 100.6-101.3 at home today, states took tylenol at 8am. Pt's RR 18-23 currently, pulse 103, 100% RA. Pt able to talk in full sentences.

## 2022-08-01 NOTE — ED Triage Notes (Signed)
Pt presents to ED with c/o of weakness. Pt states possibly exposed to [place]. Pt states home test was negative. Son was exposed and son lives with mom. NAD noted.

## 2022-08-01 NOTE — Discharge Instructions (Addendum)
-  You tested positive for COVID-19.  Please take the full course of the Paxlovid as prescribed.  -You may take the acetaminophen as needed for fever/body aches.  You may utilize the albuterol inhaler as needed for chest tightness/shortness of breath.  You may take the benzonatate as needed for cough.  -Follow-up with your primary care provider as needed.  -Return to the emergency department anytime if you begin to experience any new or worsening symptoms.

## 2022-08-22 DIAGNOSIS — Z419 Encounter for procedure for purposes other than remedying health state, unspecified: Secondary | ICD-10-CM | POA: Diagnosis not present

## 2022-09-20 DIAGNOSIS — Z419 Encounter for procedure for purposes other than remedying health state, unspecified: Secondary | ICD-10-CM | POA: Diagnosis not present

## 2022-10-21 DIAGNOSIS — Z419 Encounter for procedure for purposes other than remedying health state, unspecified: Secondary | ICD-10-CM | POA: Diagnosis not present

## 2022-11-20 DIAGNOSIS — Z419 Encounter for procedure for purposes other than remedying health state, unspecified: Secondary | ICD-10-CM | POA: Diagnosis not present

## 2022-12-21 DIAGNOSIS — Z419 Encounter for procedure for purposes other than remedying health state, unspecified: Secondary | ICD-10-CM | POA: Diagnosis not present

## 2023-01-20 DIAGNOSIS — Z419 Encounter for procedure for purposes other than remedying health state, unspecified: Secondary | ICD-10-CM | POA: Diagnosis not present

## 2023-02-20 DIAGNOSIS — Z419 Encounter for procedure for purposes other than remedying health state, unspecified: Secondary | ICD-10-CM | POA: Diagnosis not present

## 2023-03-23 DIAGNOSIS — Z419 Encounter for procedure for purposes other than remedying health state, unspecified: Secondary | ICD-10-CM | POA: Diagnosis not present

## 2023-04-22 DIAGNOSIS — Z419 Encounter for procedure for purposes other than remedying health state, unspecified: Secondary | ICD-10-CM | POA: Diagnosis not present

## 2023-05-15 ENCOUNTER — Encounter (HOSPITAL_COMMUNITY): Payer: Self-pay | Admitting: Emergency Medicine

## 2023-05-15 ENCOUNTER — Emergency Department (HOSPITAL_COMMUNITY)
Admission: EM | Admit: 2023-05-15 | Discharge: 2023-05-15 | Disposition: A | Discharge status: Home / Self Care | Payer: BC Managed Care – PPO | Attending: Emergency Medicine | Admitting: Emergency Medicine

## 2023-05-15 ENCOUNTER — Other Ambulatory Visit: Payer: Self-pay

## 2023-05-15 DIAGNOSIS — I10 Essential (primary) hypertension: Secondary | ICD-10-CM | POA: Diagnosis not present

## 2023-05-15 DIAGNOSIS — Y9301 Activity, walking, marching and hiking: Secondary | ICD-10-CM | POA: Diagnosis not present

## 2023-05-15 DIAGNOSIS — Y990 Civilian activity done for income or pay: Secondary | ICD-10-CM | POA: Diagnosis not present

## 2023-05-15 DIAGNOSIS — S86112A Strain of other muscle(s) and tendon(s) of posterior muscle group at lower leg level, left leg, initial encounter: Secondary | ICD-10-CM | POA: Insufficient documentation

## 2023-05-15 DIAGNOSIS — X58XXXA Exposure to other specified factors, initial encounter: Secondary | ICD-10-CM | POA: Diagnosis not present

## 2023-05-15 DIAGNOSIS — M79605 Pain in left leg: Secondary | ICD-10-CM | POA: Diagnosis present

## 2023-05-15 MED ORDER — IBUPROFEN 800 MG PO TABS
800.0000 mg | ORAL_TABLET | Freq: Once | ORAL | Status: AC
  Administered 2023-05-15: 800 mg via ORAL
  Filled 2023-05-15: qty 1

## 2023-05-15 MED ORDER — METHOCARBAMOL 500 MG PO TABS
500.0000 mg | ORAL_TABLET | Freq: Once | ORAL | Status: AC
  Administered 2023-05-15: 500 mg via ORAL
  Filled 2023-05-15: qty 1

## 2023-05-15 MED ORDER — METHOCARBAMOL 500 MG PO TABS: 500.0000 mg | ORAL_TABLET | Freq: Two times a day (BID) | ORAL | 0 refills | Status: DC

## 2023-05-15 NOTE — ED Provider Notes (Signed)
[place] EMERGENCY DEPARTMENT AT [facility] Provider Note   CSN: [id number] Arrival date & time: 05/15/23  1302     History  Chief Complaint  Patient presents with   Leg Pain    Mary Nash is a 32 y.o. female with past medical history significant for depression, anxiety, obesity presents with concern for left leg pain.  Patient reports she was walking at work and felt a significant cramp behind the left leg and calf.  She reports that she put some ice on it without relief.  Patient denies any popping sound, she not take anything for pain prior to arrival.  She reports that someone at work was worried that it could have been her ACL.  She denies any numbness, tingling.  She denies any previous injury to the affected leg.   Leg Pain      Home Medications Prior to Admission medications   Medication Sig Start Date End Date Taking? Authorizing Provider  methocarbamol (ROBAXIN) 500 MG tablet Take 1 tablet (500 mg total) by mouth 2 (two) times daily. 05/15/23  Yes [place]e Ciavarella H, PA-C  acetaminophen (TYLENOL) 500 MG tablet Take 2 tablets (1,000 mg total) by mouth every 6 (six) hours as needed. 08/01/22 08/01/23  Varney Daily, PA  albuterol (VENTOLIN HFA) 108 (90 Base) MCG/ACT inhaler Inhale 2 puffs into the lungs every 6 (six) hours as needed for wheezing or shortness of breath. 08/01/22   Varney Daily, PA  doxycycline (VIBRA-TABS) 100 MG tablet Take 1 tablet (100 mg total) by mouth 2 (two) times daily. 01/16/22   Glenna Fellows, NP  etonogestrel (NEXPLANON) 68 MG IMPL implant Inject into the skin. 10/29/17   [provider]  ibuprofen (ADVIL) 800 MG tablet Take 1 tablet (800 mg total) by mouth every 8 (eight) hours as needed. Patient not taking: Reported on 12/19/2021 10/29/19   Duanne Guess, MD  naproxen (NAPROSYN) 250 MG tablet Take by mouth 2 (two) times daily with a meal.    [provider]      Allergies    Patient has no known  allergies.    Review of Systems   Review of Systems  All other systems reviewed and are negative.   Physical Exam Updated Vital Signs BP (!) 155/119   Pulse 72   Temp 98 F (36.7 C) (Oral)   Resp 18   Ht 5\' 6"  (1.676 m)   Wt 122 kg   SpO2 99%   BMI 43.42 kg/m  Physical Exam Vitals and nursing note reviewed.  Constitutional:      General: She is not in acute distress.    Appearance: Normal appearance.  HENT:     Head: Normocephalic and atraumatic.  Eyes:     General:        Right eye: No discharge.        Left eye: No discharge.  Cardiovascular:     Rate and Rhythm: Normal rate and regular rhythm.     Pulses: Normal pulses.  Pulmonary:     Effort: Pulmonary effort is normal. No respiratory distress.  Musculoskeletal:        General: No deformity.     Comments: Patient with some tenderness to palpation without soft tissue swelling of the left calf.  Calf sizes are equal bilaterally.  Intact strength to plantarflexion, dorsiflexion of the affected ankle.  No palpable deformity of gastroc, or Achilles tendon.  Intact strength to flexion, extension at the knee 5/5.  Skin:  General: Skin is warm and dry.  Neurological:     Mental Status: She is alert and oriented to person, place, and time.  Psychiatric:        Mood and Affect: Mood normal.        Behavior: Behavior normal.     ED Results / Procedures / Treatments   Labs (all labs ordered are listed, but only abnormal results are displayed) Labs Reviewed - No data to display  EKG None  Radiology No results found.  Procedures Procedures    Medications Ordered in ED Medications  methocarbamol (ROBAXIN) tablet 500 mg (500 mg Oral Given 05/15/23 1411)  ibuprofen (ADVIL) tablet 800 mg (800 mg Oral Given 05/15/23 1411)    ED Course/ Medical Decision Making/ A&P                                 Medical Decision Making  This patient is a 32 y.o. female who presents to the ED for concern of left calf pain,  cramping.   Differential diagnoses prior to evaluation: DVT, gastroc strain, gastroc rupture, Achilles tendon rupture, versus simple muscular cramp  Past Medical History / Social History / Additional history: Chart reviewed. Pertinent results include: Obesity, otherwise overall noncontributory, previous blood clots for other independent risk factors for clotting disorder.  Physical Exam: Physical exam performed. The pertinent findings include: Patient with some tenderness to palpation without soft tissue swelling of the left calf.  Calf sizes are equal bilaterally.  Intact strength to plantarflexion, dorsiflexion of the affected ankle.  No palpable deformity of gastroc, or Achilles tendon.  Intact strength to flexion, extension at the knee 5/5.  She is somewhat hypertensive in the emergency department, blood pressure 161/108.  On recheck 155/119, not significantly improved.  We discussed management for blood pressure  Medications / Treatment: Patient feeling improved after Robaxin, ibuprofen, okay from my perspective for discharge, will need orthopedic follow-up if pain continues.   Disposition: After consideration of the diagnostic results and the patients response to treatment, I feel that patient is stable for discharge with plan as above.   emergency department workup does not suggest an emergent condition requiring admission or immediate intervention beyond what has been performed at this time. The plan is: as above. The patient is safe for discharge and has been instructed to return immediately for worsening symptoms, change in symptoms or any other concerns.  Final Clinical Impression(s) / ED Diagnoses Final diagnoses:  Strain of gastrocnemius muscle of left lower extremity, initial encounter  Uncontrolled hypertension    Rx / DC Orders ED Discharge Orders          Ordered    methocarbamol (ROBAXIN) 500 MG tablet  2 times daily        05/15/23 1436               Maricel Swartzendruber, [place], PA-C 05/15/23 1444    Linwood Dibbles, MD 05/15/23 1827

## 2023-05-15 NOTE — Discharge Instructions (Addendum)
Please use Tylenol or ibuprofen for pain.  You may use 600 mg ibuprofen every 6 hours or 1000 mg of Tylenol every 6 hours.  You may choose to alternate between the 2.  This would be most effective.  Not to exceed 4 g of Tylenol within 24 hours.  Not to exceed 3200 mg ibuprofen 24 hours.  I recommend rest, ice, compression, elevation of the affected extremity.  If you are not having significant improvement of pain after above I recommend that you follow-up with the orthopedic doctor whose contact information I provided above.  Additionally sometimes muscle cramping can be secondary to dehydration, I recommend that you drink plenty of fluids including Gatorade or Pedialyte, would be best to drink around 48 to 56 ounces per day  You can use the muscle relaxant I am prescribing in addition to the above to help with any breakthrough pain.  You can take it up to twice daily.  It is safe to take at night, but I would be cautious taking it during the day as it can cause some drowsiness.  Make sure that you are feeling awake and alert before you get behind the wheel of a car or operate a motor vehicle.  It is not a narcotic pain medication so you are able to take it if it is not making you drowsy and still pilot a vehicle or machinery safely.  As we discussed your blood pressure is quite high in the emergency department, you declined any additional medication, although I would recommend it at this time, considering you do have a primary care doctor I think reasonable to follow-up with them, but if your blood pressure remains high it is important to be on a blood pressure lowering agent as this can cause long-term side effects to your heart and other organs if it is persistently elevated.

## 2023-05-15 NOTE — ED Triage Notes (Signed)
Patient arrives in wheelchair by POV from work c/o left leg pain. Patient states she was walking at work and felt like a cramp in her left leg. Attempted to put ice on it w/o relief. Pain worse behind left knee.

## 2023-05-23 DIAGNOSIS — Z419 Encounter for procedure for purposes other than remedying health state, unspecified: Secondary | ICD-10-CM | POA: Diagnosis not present

## 2023-06-22 DIAGNOSIS — Z419 Encounter for procedure for purposes other than remedying health state, unspecified: Secondary | ICD-10-CM | POA: Diagnosis not present

## 2023-07-23 DIAGNOSIS — Z419 Encounter for procedure for purposes other than remedying health state, unspecified: Secondary | ICD-10-CM | POA: Diagnosis not present

## 2023-08-21 ENCOUNTER — Other Ambulatory Visit: Payer: Self-pay

## 2023-08-21 ENCOUNTER — Emergency Department
Admission: EM | Admit: 2023-08-21 | Discharge: 2023-08-21 | Disposition: A | Discharge status: Home / Self Care | Payer: BC Managed Care – PPO | Attending: Emergency Medicine | Admitting: Emergency Medicine

## 2023-08-21 DIAGNOSIS — R059 Cough, unspecified: Secondary | ICD-10-CM | POA: Diagnosis present

## 2023-08-21 DIAGNOSIS — J40 Bronchitis, not specified as acute or chronic: Secondary | ICD-10-CM | POA: Diagnosis not present

## 2023-08-21 DIAGNOSIS — Z20822 Contact with and (suspected) exposure to covid-19: Secondary | ICD-10-CM | POA: Insufficient documentation

## 2023-08-21 DIAGNOSIS — J069 Acute upper respiratory infection, unspecified: Secondary | ICD-10-CM

## 2023-08-21 LAB — RESP PANEL BY RT-PCR (RSV, FLU A&B, COVID)  RVPGX2
Influenza A by PCR: NEGATIVE
Influenza B by PCR: NEGATIVE
Resp Syncytial Virus by PCR: NEGATIVE
SARS Coronavirus 2 by RT PCR: NEGATIVE

## 2023-08-21 MED ORDER — PSEUDOEPH-BROMPHEN-DM 30-2-10 MG/5ML PO SYRP: 5.0000 mL | ORAL_SOLUTION | Freq: Four times a day (QID) | ORAL | 0 refills | Status: AC | PRN

## 2023-08-21 MED ORDER — ONDANSETRON 4 MG PO TBDP
4.0000 mg | ORAL_TABLET | Freq: Once | ORAL | Status: AC
Start: 2023-08-21 — End: 2023-08-21
  Administered 2023-08-21: 4 mg via ORAL
  Filled 2023-08-21: qty 1

## 2023-08-21 MED ORDER — ONDANSETRON 4 MG PO TBDP: 4.0000 mg | ORAL_TABLET | Freq: Three times a day (TID) | ORAL | 0 refills | Status: AC | PRN

## 2023-08-21 MED ORDER — PREDNISONE 20 MG PO TABS: 40.0000 mg | ORAL_TABLET | Freq: Every day | ORAL | 0 refills | Status: AC

## 2023-08-21 NOTE — ED Triage Notes (Signed)
Pt reports that she was diagnosed with flu x 2 weeks ago but has continued to have cough and congestion. Pt had episode of vomiting yesterday and now has headache and body aches and employer is requiring her be seen.

## 2023-08-21 NOTE — Discharge Instructions (Signed)
Take the prescription meds as directed.  Follow-up with your primary provider for ongoing concerns.

## 2023-08-21 NOTE — ED Provider Notes (Signed)
[facility] Emergency Department Provider Note     Event Date/Time   First MD Initiated Contact with Patient 08/21/23 2243     (approximate)   History   Cough   HPI  Mary Nash is a 33 y.o. female with a history of hypoglycemia, depression, anxiety, presents to the ED following ongoing cough and congestion.  Patient was recent diagnosed with the flu and then diagnosed with bronchitis following that.  She reported episode of nonbloody, nonbilious emesis yesterday and now has intermittent headache and bodyaches.  She has been evaluated in the ED at the request of her employer before returning to work.  Physical Exam   Triage Vital Signs: ED Triage Vitals [08/21/23 2216]  Encounter Vitals Group     BP 107/86     Systolic BP Percentile      Diastolic BP Percentile      Pulse Rate (!) 108     Resp 15     Temp 99 F (37.2 C)     Temp Source Oral     SpO2 99 %     Weight      Height      Head Circumference      Peak Flow      Pain Score 6     Pain Loc      Pain Education      Exclude from Growth Chart     Most recent vital signs: Vitals:   08/21/23 2216  BP: 107/86  Pulse: (!) 108  Resp: 15  Temp: 99 F (37.2 C)  SpO2: 99%    General Awake, no distress. NAD HEENT NCAT. PERRL. EOMI. No rhinorrhea. Mucous membranes are moist.  CV:  Good peripheral perfusion. RRR RESP:  Normal effort. CTA ABD:  No distention.    ED Results / Procedures / Treatments   Labs (all labs ordered are listed, but only abnormal results are displayed) Labs Reviewed  RESP PANEL BY RT-PCR (RSV, FLU A&B, COVID)  [id number]     EKG   RADIOLOGY No results found.   PROCEDURES:  Critical Care performed: No  Procedures   MEDICATIONS ORDERED IN ED: Medications  ondansetron (ZOFRAN-ODT) disintegrating tablet 4 mg (4 mg Oral Given 08/21/23 2238)     IMPRESSION / MDM / ASSESSMENT AND PLAN / ED COURSE  I reviewed the triage vital signs and the  nursing notes.                              Differential diagnosis includes, but is not limited to, COVID, flu, RSV, viral URI, bronchitis  Patient's presentation is most consistent with acute complicated illness / injury requiring diagnostic workup.  Patient's diagnosis is consistent with viral URI with cough. Patient will be discharged home with prescriptions for Bromfed-DM, Zofran, and prednisone.  Patient will continue with previously prescribed medications including albuterol inhaler.  Patient is to follow up with provider as discussed, as needed or otherwise directed. Patient is given ED precautions to return to the ED for any worsening or new symptoms.     FINAL CLINICAL IMPRESSION(S) / ED DIAGNOSES   Final diagnoses:  Bronchitis  Viral URI with cough     Rx / DC Orders   ED Discharge Orders          Ordered    brompheniramine-pseudoephedrine-DM 30-2-10 MG/5ML syrup  4 times daily PRN        08/21/23  2245    ondansetron (ZOFRAN-ODT) 4 MG disintegrating tablet  Every 8 hours PRN        08/21/23 2245    predniSONE (DELTASONE) 20 MG tablet  Daily with breakfast        08/21/23 2245             Note:  This document was prepared using Dragon voice recognition software and may include unintentional dictation errors.    Lissa Hoard, PA-C 08/21/23 2317    Chesley Noon, MD 08/22/23 Marlyne Beards

## 2023-08-23 DIAGNOSIS — Z419 Encounter for procedure for purposes other than remedying health state, unspecified: Secondary | ICD-10-CM | POA: Diagnosis not present

## 2023-09-20 DIAGNOSIS — Z419 Encounter for procedure for purposes other than remedying health state, unspecified: Secondary | ICD-10-CM | POA: Diagnosis not present

## 2023-11-01 DIAGNOSIS — Z419 Encounter for procedure for purposes other than remedying health state, unspecified: Secondary | ICD-10-CM | POA: Diagnosis not present

## 2023-12-01 DIAGNOSIS — Z419 Encounter for procedure for purposes other than remedying health state, unspecified: Secondary | ICD-10-CM | POA: Diagnosis not present

## 2024-01-01 DIAGNOSIS — Z419 Encounter for procedure for purposes other than remedying health state, unspecified: Secondary | ICD-10-CM | POA: Diagnosis not present

## 2024-01-31 DIAGNOSIS — Z419 Encounter for procedure for purposes other than remedying health state, unspecified: Secondary | ICD-10-CM | POA: Diagnosis not present

## 2024-03-02 DIAGNOSIS — Z419 Encounter for procedure for purposes other than remedying health state, unspecified: Secondary | ICD-10-CM | POA: Diagnosis not present

## 2024-04-02 DIAGNOSIS — Z419 Encounter for procedure for purposes other than remedying health state, unspecified: Secondary | ICD-10-CM | POA: Diagnosis not present
